# Patient Record
Sex: Male | Born: 1990 | Race: Black or African American | Hispanic: No | Marital: Single | Smoking: Never smoker
Health system: Southern US, Community
[De-identification: ages and names within clinical notes are randomized; demographics above are authoritative.]

## PROBLEM LIST (undated history)

## (undated) DIAGNOSIS — L709 Acne, unspecified: Secondary | ICD-10-CM

## (undated) DIAGNOSIS — R9431 Abnormal electrocardiogram [ECG] [EKG]: Secondary | ICD-10-CM

## (undated) HISTORY — DX: Abnormal electrocardiogram (ECG) (EKG): R94.31

## (undated) HISTORY — PX: HAND SURGERY: SHX662

---
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
## 2010-06-08 ENCOUNTER — Emergency Department (HOSPITAL_COMMUNITY)
Admission: EM | Admit: 2010-06-08 | Discharge: 2010-06-08 | Disposition: A | Discharge status: Home / Self Care | Payer: Self-pay | Attending: Emergency Medicine | Admitting: Emergency Medicine

## 2010-06-08 ENCOUNTER — Emergency Department (HOSPITAL_COMMUNITY): Payer: Self-pay

## 2010-06-08 DIAGNOSIS — X500XXA Overexertion from strenuous movement or load, initial encounter: Secondary | ICD-10-CM | POA: Insufficient documentation

## 2010-06-08 DIAGNOSIS — IMO0002 Reserved for concepts with insufficient information to code with codable children: Secondary | ICD-10-CM | POA: Insufficient documentation

## 2010-06-08 DIAGNOSIS — Y9367 Activity, basketball: Secondary | ICD-10-CM | POA: Insufficient documentation

## 2010-06-08 DIAGNOSIS — M25569 Pain in unspecified knee: Secondary | ICD-10-CM | POA: Insufficient documentation

## 2010-08-26 NOTE — Op Note (Signed)
NAMEDAMIEL, BARTHOLD NO.:  [id number]   MEDICAL RECORD NO.:  [id number]          PATIENT TYPE:  EMS   LOCATION:  ED                           FACILITY:  [facility]   PHYSICIAN:  Dionne Ano. Gramig III, M.D.DATE OF BIRTH:  1991-02-19   DATE OF PROCEDURE:  09/26/2006  DATE OF DISCHARGE:  09/26/2006                               OPERATIVE REPORT   PREOPERATIVE DIAGNOSIS:  Laceration to the right hand and wrist in two  different levels with retained large foreign body and dysesthesias in  the common digital nerve to the second web space and radial digital  nerve to the index finger region.   POSTOPERATIVE DIAGNOSIS:  Laceration to the right hand and wrist in two  different levels with retained large foreign body and dysesthesias in  the common digital nerve to the second web space and radial digital  nerve to the index finger region.   PROCEDURE:  1. Incision and drainage skin, subcutaneous tissue and deep fascial as      well as tendon tissue right palm.  2. Large foreign body removal, right palm.  3. Incision and drainage right wrist laceration 2 cm in length skin,      subcutaneous tissue.  This was excisional debridement.  4. Neurolysis of the common digital nerve to the second web space and      a separate neurolysis to the radial digital nerve to the index      finger under loupe magnification.   SURGEON:  Dionne Ano. Amanda Pea, M.D.   ASSISTANT:  None.   COMPLICATIONS:  None.   ANESTHESIA:  General.   TOURNIQUET TIME:  Less than hour.   INDICATIONS FOR PROCEDURE:  This patient presents with a very large  foreign body, dysesthesias and inability to move the hand.  I have  discussed with him the risks and benefits of surgery including risk of  infection, bleeding anesthesia, damage normal structures and failure of  the surgery to accomplish its intended goals of relieving symptoms and  restoring function.  The object in question is a very large piece of  glass.   The patient's mother was consented, the patient underwent a full  discussion preoperatively.  Arm was marked.  Consent was signed.  He was  taken to the operative suite.   Under general anesthesia, the patient underwent thorough prep and drape  with Betadine scrub paint.  Following this I&D skin, subcutaneous  tissue, tendon and muscle tissue was accomplished as was an excisional  debridement in the palm.  Following this, I then removed a very large  piece of glass greater than 1 to 1-1/2 inch.  This was removed and set  aside and later given to the patient.  Following removal of the very  large piece of glass I then performed a neurolysis of the second web  common digital nerve.  This was meticulously and carefully neurolysed.  It was bruised but was intact.   Following this, I then performed a neurolysis of the radial digital  nerve to the index finger.  This was contused but noted to  be intact,  fortunately.  There was no fascicular disruption.   Following this, I irrigated copiously and very loosely closed the wound.   Following this the patient underwent I&D of the wrist laceration.  The  wrist underwent I&D of skin, subcutaneous tissue.  A 2 cm laceration was  very carefully probed and cleaned.  Following this, direct closure was  accomplished.   The patient tolerated this well.  The flexor tendons were checked in the  palm region and were intact.  There was no complicating features.  Thorough exploration and I&D was performed and there were no  complicating features.   The patient's family was counseled in regard to the upper extremity  predicament.  He was dressed sterilely and will be discharged home on  appropriate pain medicine as well as postoperative measures including  elevation, return to the office in 3-5 days and he will be discharged  also on Keflex 500 mg one p.o. q.i.d. times 10 days, Vicodin 1-2 q.4-6h.  p.r.n. pain p.o.  All questions have been  encouraged and answered.  It  was a pleasure to see him and participate in his care.           ______________________________  Dionne Ano. Everlene Other, M.D.     Nash Mantis  D:  10/26/2006  T:  10/27/2006  Job:  [id number]

## 2010-08-29 NOTE — Op Note (Signed)
NAMEIVAN, LACHER NO.:  [id number]   MEDICAL RECORD NO.:  [id number]          PATIENT TYPE:  EMS   LOCATION:  ED                           FACILITY:  [facility]   PHYSICIAN:  Dionne Ano. Gramig III, M.D.DATE OF BIRTH:  29-Nov-1990   DATE OF PROCEDURE:  09/27/2006  DATE OF DISCHARGE:                               OPERATIVE REPORT   PREOPERATIVE DIAGNOSIS:  Status post large glass laceration to the right  hand, sustaining lacerations to the wrist crease and palm with  significant embedded foreign glass x2 large pieces, rule out  neurovascular injury.   POSTOPERATIVE DIAGNOSIS:  Status post large glass laceration to the  right hand, sustaining lacerations to the wrist crease and palm with  significant embedded foreign glass x2 large pieces, rule out  neurovascular injury.   OPERATION PERFORMED:  1. Incision and drainage of subcutaneous tissue as well as muscle and      tendon tissue, right palm.  This was excisional debridement.  2. Removal of two foreign bodies in the form of large glass pieces,      one measuring greater than 1.5 inches, the second measuring 1 cm.  3. Common digital nerve to the second web space neurolysis.  4. Radial digital nerve neurolysis, right index finger.  5. Incision and drainage, right wrist skin and subcutaneous tissue      with direct closure.   SURGEON:  Dionne Ano. Amanda Pea, M.D.   ASSISTANT:  None.   COMPLICATIONS:  None.   ANESTHESIA:  General.   TOURNIQUET TIME:  Less than an hour.   INDICATIONS FOR PROCEDURE:  This patient is a 20 year old male presents  to the emergency room for evaluation and treatment.  He has two large  pieces of glass in his palm and difficulty flexing and extending the  digits.  The patient and I discussed his upper extremity predicament and  I have extensively outlined my recommendations to his mother and father.  They desire to proceed with the above mentioned operative intervention  given the  large embedded material, dysfunction of his hand, etc.  With  this in mind, we will proceed accordingly.   DESCRIPTION OF PROCEDURE:  The patient was taken to the procedural  suite.  Palm  was marked, consent signed and full discussion with the  parents was complete.  He was taken to the operative suite, underwent a  general anesthetic.  Once this was done he was laid supine,  appropriately padded, prepped and draped in the usual sterile fashion  with Betadine scrub and paint about the left upper extremity.  Following  this, the patient then underwent sterile field application and his  operation commenced with insufflation of the tourniquet to 250 mmHg.  Incision and drainage of skin and subcutaneous tissue at the wrist  crease was accomplished. There was no deep penetration. This was an  excisional debridement.  Copiously irrigated and then closed with  Prolene.   Following this, an incision 1.5 cm proximal and distal to the entrance  site of the puncture wound in the midpalm was enlarged.  Dissection was  carried down, palmar fascia incised and retractor was placed.  Once  retractor was placed, I then very carefully removed a 1 cm piece of  glass followed by removal of a second 1.5 inch piece of glass.  Following this, I then performed a neurolysis of the common digital  nerve to the second web space and the radial digital nerve to the index  finger.  Both nerves were intact, somewhat contused and underwent a  neurolysis with evacuation of hematoma.  I then irrigated copiously with  tourniquet deflated and ample amounts of saline placed in the wound for  lavage purposes.  I found no signs of infection, no evidence of an  obvious flexor tendon disrupture.   The patient tolerated the procedure well. Hemostasis was adequate,  compartments were soft and there were no complicating features.  The  wound was loosely closed with Prolene and sterile dressing was applied.  The two glass  fragments were given to the patient's family at their  request and positively identified at time of surgery.  The patient had  no complications from the surgery.  He was dressed sterilely, taken to  recovery room, extubated and was noted to be in stable condition.  He  will be discharged home on Keflex 500 mg 1 by mouth four times daily x  10 days and in addition to this will be placed on Vicodin 5 mg 2 every  four to six hours as needed for pain by mouth.  Ice, elevation, finger  range of motion, edema control and other measures will be adhered to.  If he has any problems they will notify me.  Otherwise I look forward to  seeing him in five to seven days.  All questions have been encouraged  and answered.           ______________________________  Dionne Ano. Everlene Other, M.D.     Nash Mantis  D:  09/27/2006  T:  09/27/2006  Job:  [id number]

## 2011-06-15 ENCOUNTER — Emergency Department (HOSPITAL_COMMUNITY): Payer: 59

## 2011-06-15 ENCOUNTER — Emergency Department (HOSPITAL_COMMUNITY)
Admission: EM | Admit: 2011-06-15 | Discharge: 2011-06-15 | Disposition: A | Discharge status: Home / Self Care | Payer: 59 | Attending: Emergency Medicine | Admitting: Emergency Medicine

## 2011-06-15 ENCOUNTER — Encounter (HOSPITAL_COMMUNITY): Payer: Self-pay | Admitting: Emergency Medicine

## 2011-06-15 DIAGNOSIS — IMO0002 Reserved for concepts with insufficient information to code with codable children: Secondary | ICD-10-CM | POA: Insufficient documentation

## 2011-06-15 DIAGNOSIS — H5710 Ocular pain, unspecified eye: Secondary | ICD-10-CM | POA: Insufficient documentation

## 2011-06-15 DIAGNOSIS — S0181XA Laceration without foreign body of other part of head, initial encounter: Secondary | ICD-10-CM

## 2011-06-15 DIAGNOSIS — S0180XA Unspecified open wound of other part of head, initial encounter: Secondary | ICD-10-CM | POA: Insufficient documentation

## 2011-06-15 DIAGNOSIS — Y9367 Activity, basketball: Secondary | ICD-10-CM | POA: Insufficient documentation

## 2011-06-15 NOTE — ED Notes (Signed)
Pt ambulatory to tx rm. Pt collided with another player during basketball. Pt alert, oriented and in no acute distress. Awaiting md eval.

## 2011-06-15 NOTE — ED Notes (Signed)
MD at bedside. 

## 2011-06-15 NOTE — ED Provider Notes (Signed)
History     CSN: [id number]  Arrival date & time 06/15/11  1736   First MD Initiated Contact with Patient 06/15/11 1829      Chief Complaint  Patient presents with  . Eye Injury    (Consider location/radiation/quality/duration/timing/severity/associated sxs/prior treatment) HPI History provided by patient.  21 year old male presented with complaint of left face injury. Patient reports that around 4 PM today (about 2 hours to prior emergency department arrival) patient was playing basketball and was struck in his left cheek by another player. Patient thinks it was an elbow, although is not certain. Patient experienced immediate severe pain without LOC, blurry vision, or neck pain.  No pain with EOM movement. Patient had local pain and headache which has since resolved. Patient had a Steri-Strip placed PTA.  Tetanus is up to date. Patient has not been seen by a physician for this current complaint.   History reviewed. No pertinent past medical history.  History reviewed. No pertinent past surgical history.  No family history on file.  History  Substance Use Topics  . Smoking status: Never Smoker   . Smokeless tobacco: Not on file  . Alcohol Use: No      Review of Systems  Constitutional: Negative for fever and chills.  HENT: Negative for congestion, sore throat and rhinorrhea.   Eyes: Negative for pain and visual disturbance.  Respiratory: Negative for cough and shortness of breath.   Cardiovascular: Negative for chest pain and palpitations.  Gastrointestinal: Negative for nausea, vomiting, abdominal pain and diarrhea.  Genitourinary: Negative for dysuria and hematuria.  Musculoskeletal: Negative for back pain and gait problem.  Skin: Positive for wound. Negative for rash.  Neurological: Negative for dizziness and headaches.  Psychiatric/Behavioral: Negative for confusion and agitation.  All other systems reviewed and are negative.    Allergies  Review of patient's  allergies indicates no known allergies.  Home Medications   Current Outpatient Rx  Name Route Sig Dispense Refill  . DOXYCYCLINE HYCLATE 100 MG PO CAPS Oral Take 100 mg by mouth daily.      BP 143/75  Pulse 60  Temp(Src) 98.4 F (36.9 C) (Oral)  Resp 12  SpO2 99%  Physical Exam  Nursing note and vitals reviewed. Constitutional: He is oriented to person, place, and time. He appears well-developed and well-nourished. No distress.  HENT:  Head: Normocephalic and atraumatic.    Right Ear: External ear normal.  Left Ear: External ear normal.  Nose: Nose normal.  Mouth/Throat: Oropharynx is clear and moist.  Eyes: Conjunctivae and EOM are normal. Pupils are equal, round, and reactive to light.  Neck: Normal range of motion. Neck supple.  Cardiovascular: Normal rate, regular rhythm and intact distal pulses.   No murmur heard. Pulmonary/Chest: Effort normal and breath sounds normal. No respiratory distress.  Abdominal: Soft. Bowel sounds are normal. He exhibits no distension. There is no tenderness.  Musculoskeletal: Normal range of motion. He exhibits no edema.  Neurological: He is alert and oriented to person, place, and time. No cranial nerve deficit. He exhibits normal muscle tone. Coordination normal.  Skin: Skin is dry. No rash noted. He is not diaphoretic.  Psychiatric: He has a normal mood and affect. Judgment normal.    ED Course  LACERATION REPAIR Performed by: Particia Lather Authorized by: Hurman Horn Consent: Verbal consent obtained. Body area: head/neck Location details: left cheek Irrigation solution: saline Skin closure: glue Patient tolerance: Patient tolerated the procedure well with no immediate complications.   (including critical care  time)  Labs Reviewed - No data to display Ct Orbitss W/o Cm  06/15/2011  *RADIOLOGY REPORT*  Clinical Data: Basketball injury, with laceration under the left eye.  Periorbital swelling.  CT ORBITS WITHOUT CONTRAST   Technique:  Multidetector CT imaging of the orbits was performed following the standard protocol without intravenous contrast.  Comparison: 09/26/2006  Findings: No orbital fracture is identified.  There is mild chronic left maxillary sinusitis.  The zygomatic arches appear intact and symmetric.  No fracture of the visualized nasal bones noted.  Left infraorbital soft tissue swelling noted.  The globes appear symmetric.  No postseptal/intraorbital abnormality is observed.  IMPRESSION:  1.  Left periorbital soft tissue swelling, especially inferiorly. No intraorbital abnormality or orbital fracture is observed. 2.  Mild chronic left maxillary sinusitis.  Original Report Authenticated By: Dellia Cloud, M.D.     1. Laceration of face       MDM  2-year-old male presenting status post left cheek injury from suspected elbow while playing basketball.  No LOC, vision change, or other neuro Sx.    Exam as above, laceration below Lt eye with TTP.  CT orbits ordered for r/o orbital/maxillary fx and neg.  Lac repaired as noted above with skin adhesive.  Wound care instructions reviewed; PCP f/u PRN.       Particia Lather, MD 06/16/11 [phone]

## 2011-06-15 NOTE — ED Provider Notes (Signed)
This 21 year old male was accidentally elbowed in the left infraorbital area with no change in vision no severe headache no loss of consciousness no amnesia but they left upper cheek horizontal laceration with left infraorbital rim bony pain and tenderness with pupils reactive, extraocular movements intact, and no diplopia, TMJs NT with normal jaw open and occlusion, nose NT.  I saw and evaluated the patient, reviewed the resident's note and I agree with the findings and plan and available during procedure.  Hurman Horn, MD 06/16/11 2251

## 2011-06-15 NOTE — Discharge Instructions (Signed)
Facial Laceration A facial laceration is a cut on the face. Lacerations usually heal quickly, but they need special care to reduce scarring. It will take 1 to 2 years for the scar to lose its redness and to heal completely. TREATMENT  Some facial lacerations may not require closure. Some lacerations may not be able to be closed due to an increased risk of infection. It is important to see your caregiver as soon as possible after an injury to minimize the risk of infection and to maximize the opportunity for successful closure. If closure is appropriate, pain medicines may be given, if needed. The wound will be cleaned to help prevent infection. Your caregiver will use stitches (sutures), staples, wound glue (adhesive), or skin adhesive strips to repair the laceration. These tools bring the skin edges together to allow for faster healing and a better cosmetic outcome. However, all wounds will heal with a scar.  Once the wound has healed, scarring can be minimized by covering the wound with sunscreen during the day for 1 full year. Use a sunscreen with an SPF of at least 30. Sunscreen helps to reduce the pigment that will form in the scar. When applying sunscreen to a completely healed wound, massage the scar for a few minutes to help reduce the appearance of the scar. Use circular motions with your fingertips, on and around the scar. Do not massage a healing wound. HOME CARE INSTRUCTIONS For wound adhesive:  You may briefly wet your wound in the shower or bath. Do not soak or scrub the wound. Do not swim. Avoid periods of heavy perspiration until the skin adhesive has fallen off on its own. After showering or bathing, gently pat the wound dry with a clean towel.   Do not apply liquid medicine, cream medicine, ointment medicine, or makeup to your wound while the skin adhesive is in place. This may loosen the film before your wound is healed.   If a dressing is placed over the wound, be careful not to  apply tape directly over the skin adhesive. This may cause the adhesive to be pulled off before the wound is healed.   Avoid prolonged exposure to sunlight or tanning lamps while the skin adhesive is in place. Exposure to ultraviolet light in the first year will darken the scar.   The skin adhesive will usually remain in place for 5 to 10 days, then naturally fall off the skin. Do not pick at the adhesive film.  You may need a tetanus shot if:  You cannot remember when you had your last tetanus shot.   You have never had a tetanus shot.  If you get a tetanus shot, your arm may swell, get red, and feel warm to the touch. This is common and not a problem. If you need a tetanus shot and you choose not to have one, there is a rare chance of getting tetanus. Sickness from tetanus can be serious. SEEK IMMEDIATE MEDICAL CARE IF:  You develop redness, pain, or swelling around the wound.   There is yellowish-white fluid (pus) coming from the wound.   You develop chills or a fever.  MAKE SURE YOU:  Understand these instructions.   Will watch your condition.   Will get help right away if you are not doing well or get worse.  Document Released: 05/07/2004 Document Revised: 03/19/2011 Document Reviewed: 09/22/2010 [facility] Patient Information 2012 [place], [place]

## 2011-06-15 NOTE — ED Notes (Signed)
PT. COLLIDED WITH ANOTHER PLAYER WHILE PLAYING BASKETBALL THIS AFTERNOON , PRESENTS WITH LACERATION AT LEFT LOWER EYE WITH MODERATE BLEEDING , NO LOC.

## 2011-06-15 NOTE — ED Notes (Signed)
Pt to CT

## 2011-12-08 ENCOUNTER — Ambulatory Visit (INDEPENDENT_AMBULATORY_CARE_PROVIDER_SITE_OTHER): Payer: 59 | Admitting: Family Medicine

## 2011-12-08 VITALS — BP 130/78 | HR 51 | Temp 97.5°F | Resp 16 | Ht 73.5 in | Wt 169.0 lb

## 2011-12-08 DIAGNOSIS — Z0000 Encounter for general adult medical examination without abnormal findings: Secondary | ICD-10-CM

## 2011-12-08 NOTE — Progress Notes (Signed)
Adrian Murray is a 21 y.o. male who presents to [facility] today for college sports physical. Patient is a Consulting civil engineer at [employer].  He is going to try out for the basketball team.  Feels well no current issues.  No chest pain, syncope, SOB with athletic exertion.     PMH: Reviewed significant for acne treated with doxycycline Past surgical: History of right ankle sprain, right foot fracture, right knee meniscus injury.   History  Substance Use Topics  . Smoking status: Never Smoker   . Smokeless tobacco: Not on file  . Alcohol Use: No   ROS as above  Medications reviewed. Current Outpatient Prescriptions  Medication Sig Dispense Refill  . doxycycline (VIBRAMYCIN) 100 MG capsule Take 100 mg by mouth daily.        Exam:  BP 130/78  Pulse 51  Temp 97.5 F (36.4 C) (Oral)  Resp 16  Ht 6' 1.5" (1.867 m)  Wt 169 lb (76.658 kg)  BMI 21.99 kg/m2  SpO2 100% Gen: Well NAD HEENT: EOMI,  MMM Lungs: CTABL Nl WOB Heart: RRR no MRG Exts: Non edematous BL  LE, warm and well perfused.  MSK: Normal range of motion and stability strength in all major joints  No results found for this or any previous visit (from the past 72 hour(s)).  Assessment and Plan: 21 y.o. male with complete sports physical.  Doing well. No activity limiting issues uncovered today.  Plan for sickle cell screen.  Followup as needed. Cleared to participate.

## 2011-12-15 ENCOUNTER — Telehealth: Payer: Self-pay

## 2011-12-15 NOTE — Telephone Encounter (Signed)
Patient request that we fax sickle cell test results to his job at [phone]. Does not need to be attn to anyone.

## 2011-12-15 NOTE — Telephone Encounter (Signed)
Sickle cell results faxed to [phone] via [employer].

## 2012-12-25 ENCOUNTER — Emergency Department (INDEPENDENT_AMBULATORY_CARE_PROVIDER_SITE_OTHER)
Admission: EM | Admit: 2012-12-25 | Discharge: 2012-12-25 | Disposition: A | Discharge status: Home / Self Care | Payer: 59 | Source: Home / Self Care | Attending: Family Medicine | Admitting: Family Medicine

## 2012-12-25 ENCOUNTER — Encounter (HOSPITAL_COMMUNITY): Payer: Self-pay | Admitting: *Deleted

## 2012-12-25 DIAGNOSIS — B86 Scabies: Secondary | ICD-10-CM

## 2012-12-25 HISTORY — DX: Acne, unspecified: L70.9

## 2012-12-25 MED ORDER — TRIAMCINOLONE ACETONIDE 0.1 % EX CREA: TOPICAL_CREAM | Freq: Two times a day (BID) | CUTANEOUS | Status: DC

## 2012-12-25 MED ORDER — PERMETHRIN 5 % EX CREA: TOPICAL_CREAM | CUTANEOUS | Status: DC

## 2012-12-25 NOTE — ED Provider Notes (Signed)
Adrian Murray is a 22 y.o. male who presents to Urgent Care today for pruritic papules. Patient has several pruritic papules in his pubic hair as well as his beard. These are present for several weeks. He has not tried any medications yet. They are not painful. He does not have any fevers or chills. Feels well otherwise.    No past medical history on file. History  Substance Use Topics  . Smoking status: Never Smoker   . Smokeless tobacco: Not on file  . Alcohol Use: No   ROS as above Medications reviewed. No current facility-administered medications for this encounter.   Current Outpatient Prescriptions  Medication Sig Dispense Refill  . permethrin (ELIMITE) 5 % cream Apply to affected area once  60 g  0  . triamcinolone cream (KENALOG) 0.1 % Apply topically 2 (two) times daily.  30 g  0    Exam:  BP 136/73  Pulse 60  Temp(Src) 97.3 F (36.3 C) (Oral)  Resp 17  SpO2 100% Gen: Well NAD SKIN: Small pruritic papules and pubic hair and on the base of the shaft of the penis. No lesions seen in the beard.  Was normal.   No results found for this or any previous visit (from the past 24 hour(s)). No results found.  Assessment and Plan: 22 y.o. male with scabies or pubic lice possibly.  Plan to treat with permethrin cream and triamcinolone cream.  If not improving followup for referral to dermatology.  Discussed warning signs or symptoms. Please see discharge instructions. Patient expresses understanding.      Rodolph Bong, MD 12/25/12 [phone]

## 2012-12-25 NOTE — ED Notes (Signed)
Pt was discharged at 0950, but unable to discharge out of system unless pain assessment documented now.  Pain assessment documented in error.

## 2012-12-25 NOTE — ED Notes (Signed)
Triage assessment performed per Dr. Denyse Amass.

## 2012-12-25 NOTE — Discharge Instructions (Signed)
Thank you for coming in today. Use the permethrin cream.  Apply on the body and leave on overnight. Wash off in the morning.  Use triamcinolone cream as needed for itching twice daily until the skin starts looking normal.  Come back if not getting better.  Scabies Scabies are small bugs (mites) that burrow under the skin and cause red bumps and severe itching. These bugs can only be seen with a microscope. Scabies are highly contagious. They can spread easily from person to person by direct contact. They are also spread through sharing clothing or linens that have the scabies mites living in them. It is not unusual for an entire family to become infected through shared towels, clothing, or bedding.  HOME CARE INSTRUCTIONS   Your caregiver may prescribe a cream or lotion to kill the mites. If cream is prescribed, massage the cream into the entire body from the neck to the bottom of both feet. Also massage the cream into the scalp and face if your child is less than 75 year old. Avoid the eyes and mouth. Do not wash your hands after application.  Leave the cream on for 8 to 12 hours. Your child should bathe or shower after the 8 to 12 hour application period. Sometimes it is helpful to apply the cream to your child right before bedtime.  One treatment is usually effective and will eliminate approximately 95% of infestations. For severe cases, your caregiver may decide to repeat the treatment in 1 week. Everyone in your household should be treated with one application of the cream.  New rashes or burrows should not appear within 24 to 48 hours after successful treatment. However, the itching and rash may last for 2 to 4 weeks after successful treatment. Your caregiver may prescribe a medicine to help with the itching or to help the rash go away more quickly.  Scabies can live on clothing or linens for up to 3 days. All of your child's recently used clothing, towels, stuffed toys, and bed linens should  be washed in hot water and then dried in a dryer for at least 20 minutes on high heat. Items that cannot be washed should be enclosed in a plastic bag for at least 3 days.  To help relieve itching, bathe your child in a cool bath or apply cool washcloths to the affected areas.  Your child may return to school after treatment with the prescribed cream. SEEK MEDICAL CARE IF:   The itching persists longer than 4 weeks after treatment.  The rash spreads or becomes infected. Signs of infection include red blisters or yellow-tan crust. Document Released: 03/30/2005 Document Revised: 06/22/2011 Document Reviewed: 08/08/2008 [facility] Patient Information 2014 [place], [place].

## 2013-02-13 ENCOUNTER — Emergency Department (HOSPITAL_COMMUNITY)
Admission: EM | Admit: 2013-02-13 | Discharge: 2013-02-14 | Disposition: A | Discharge status: Home / Self Care | Payer: 59 | Attending: Emergency Medicine | Admitting: Emergency Medicine

## 2013-02-13 ENCOUNTER — Emergency Department (HOSPITAL_COMMUNITY): Payer: 59

## 2013-02-13 ENCOUNTER — Encounter (HOSPITAL_COMMUNITY): Payer: Self-pay | Admitting: Emergency Medicine

## 2013-02-13 DIAGNOSIS — Z872 Personal history of diseases of the skin and subcutaneous tissue: Secondary | ICD-10-CM | POA: Insufficient documentation

## 2013-02-13 DIAGNOSIS — K859 Acute pancreatitis without necrosis or infection, unspecified: Secondary | ICD-10-CM | POA: Insufficient documentation

## 2013-02-13 LAB — COMPREHENSIVE METABOLIC PANEL
Albumin: 4.4 g/dL (ref 3.5–5.2)
Alkaline Phosphatase: 71 U/L (ref 39–117)
BUN: 18 mg/dL (ref 6–23)
Calcium: 10.5 mg/dL (ref 8.4–10.5)
GFR calc Af Amer: >90 mL/min (ref >90)
Glucose, Bld: 85 mg/dL (ref 70–99)
Potassium: 3.4 mEq/L — ABNORMAL LOW (ref 3.5–5.1)
Sodium: 139 mEq/L (ref 135–145)
Total Protein: 7.9 g/dL (ref 6.0–8.3)

## 2013-02-13 LAB — URINALYSIS, ROUTINE W REFLEX MICROSCOPIC
Glucose, UA: NEGATIVE mg/dL
Ketones, ur: NEGATIVE mg/dL
Leukocytes, UA: NEGATIVE
Nitrite: NEGATIVE
Specific Gravity, Urine: 1.029 (ref 1.005–1.030)
pH: 6 (ref 5.0–8.0)

## 2013-02-13 LAB — CBC WITH DIFFERENTIAL/PLATELET
Basophils Relative: 0 % (ref 0–1)
Eosinophils Absolute: 0 10*3/uL (ref 0.0–0.7)
Eosinophils Relative: 0 % (ref 0–5)
Lymphs Abs: 1.8 10*3/uL (ref 0.7–4.0)
MCH: 29.9 pg (ref 26.0–34.0)
MCHC: 34.7 g/dL (ref 30.0–36.0)
MCV: 86.3 fL (ref 78.0–100.0)
Monocytes Relative: 7 % (ref 3–12)
Neutrophils Relative %: 74 % (ref 43–77)
Platelets: 206 10*3/uL (ref 150–400)

## 2013-02-13 LAB — LIPASE, BLOOD: Lipase: 208 U/L — ABNORMAL HIGH (ref 11–59)

## 2013-02-13 MED ORDER — SODIUM CHLORIDE 0.9 % IV BOLUS (SEPSIS)
1000.0000 mL | Freq: Once | INTRAVENOUS | Status: AC
  Administered 2013-02-13: 1000 mL via INTRAVENOUS

## 2013-02-13 MED ORDER — ONDANSETRON HCL 4 MG/2ML IJ SOLN
4.0000 mg | Freq: Once | INTRAMUSCULAR | Status: AC
  Administered 2013-02-13: 4 mg via INTRAVENOUS
  Filled 2013-02-13: qty 2

## 2013-02-13 MED ORDER — IOHEXOL 300 MG/ML  SOLN
100.0000 mL | Freq: Once | INTRAMUSCULAR | Status: AC | PRN
  Administered 2013-02-13: 100 mL via INTRAVENOUS

## 2013-02-13 MED ORDER — MORPHINE SULFATE 4 MG/ML IJ SOLN
4.0000 mg | Freq: Once | INTRAMUSCULAR | Status: AC
  Administered 2013-02-13: 4 mg via INTRAVENOUS
  Filled 2013-02-13: qty 1

## 2013-02-13 MED ORDER — IOHEXOL 300 MG/ML  SOLN
50.0000 mL | Freq: Once | INTRAMUSCULAR | Status: AC | PRN
  Administered 2013-02-13: 50 mL via ORAL

## 2013-02-13 NOTE — ED Provider Notes (Signed)
CSN: [id number]     Arrival date & time 02/13/13  2047 History   First MD Initiated Contact with Patient 02/13/13 2134     Chief Complaint  Patient presents with  . Abdominal Pain   (Consider location/radiation/quality/duration/timing/severity/associated sxs/prior Treatment) HPI Adrian Murray is a 22 y.o. male who presents to emergency department with complaint of abdominal pain. Patient states the pain has been there for 2 days, comes and goes, told the nurse at triage his pain was in the right lower quadrant however he tells me that his pain is in the left upper quadrant now. States it moves around the lower abdomen. He denies any nausea, vomiting, diarrhea or constipation. She denies taking any medications for this. He states he does not have pain when he is jumping and moving around, and does not have pain with palpation. He denies any prior similar symptoms in the past. He denies any prior abdominal surgeries. He does admit to alcohol drinking over the weekend, states drank heavily. He states he only drinks on the weekends the  Past Medical History  Diagnosis Date  . Acne    Past Surgical History  Procedure Laterality Date  . Hand surgery Right    History reviewed. No pertinent family history. History  Substance Use Topics  . Smoking status: Never Smoker   . Smokeless tobacco: Not on file  . Alcohol Use: Yes     Comment: occasional    Review of Systems  Constitutional: Negative for fever and chills.  Respiratory: Negative for cough, chest tightness and shortness of breath.   Cardiovascular: Negative for chest pain, palpitations and leg swelling.  Gastrointestinal: Positive for abdominal pain. Negative for nausea, vomiting, diarrhea and abdominal distention.  Genitourinary: Negative for dysuria, urgency, frequency and hematuria.  Musculoskeletal: Negative for arthralgias, myalgias, neck pain and neck stiffness.  Skin: Negative for rash.  Allergic/Immunologic: Negative for  immunocompromised state.  Neurological: Negative for dizziness, weakness, light-headedness, numbness and headaches.    Allergies  Review of patient's allergies indicates no known allergies.  Home Medications  No current outpatient prescriptions on file. BP 149/84  Pulse 65  Temp(Src) 97.6 F (36.4 C) (Oral)  SpO2 98% Physical Exam  Nursing note and vitals reviewed. Constitutional: He is oriented to person, place, and time. He appears well-developed and well-nourished. No distress.  HENT:  Head: Normocephalic and atraumatic.  Eyes: Conjunctivae are normal.  Neck: Neck supple.  Cardiovascular: Normal rate, regular rhythm and normal heart sounds.   Pulmonary/Chest: Effort normal. No respiratory distress. He has no wheezes. He has no rales.  Abdominal: Soft. Bowel sounds are normal. He exhibits no distension. There is no tenderness. There is no rebound.  No tenderness on exam, no CVA tenderness  Musculoskeletal: He exhibits no edema.  Neurological: He is alert and oriented to person, place, and time.  Skin: Skin is warm and dry.    ED Course  Procedures (including critical care time) Labs Review Labs Reviewed  CBC WITH DIFFERENTIAL - Abnormal; Notable for the following:    HCT 38.9 (*)    All other components within normal limits  COMPREHENSIVE METABOLIC PANEL - Abnormal; Notable for the following:    Potassium 3.4 (*)    All other components within normal limits  URINALYSIS, ROUTINE W REFLEX MICROSCOPIC - Abnormal; Notable for the following:    APPearance CLOUDY (*)    All other components within normal limits  LIPASE, BLOOD - Abnormal; Notable for the following:    Lipase 208 (*)  All other components within normal limits   Imaging Review Ct Abdomen Pelvis W Contrast  02/14/2013   CLINICAL DATA:  22 year old male with epigastric pain. Initial encounter.  EXAM: CT ABDOMEN AND PELVIS WITH CONTRAST  TECHNIQUE: Multidetector CT imaging of the abdomen and pelvis was  performed using the standard protocol following bolus administration of intravenous contrast.  CONTRAST:  50mL OMNIPAQUE IOHEXOL 300 MG/ML SOLN, OMNIPAQUE IOHEXOL 300 MG/ML SOLN  COMPARISON:  Abdomen radiographs from the same day.  FINDINGS: There is mild cardiomegaly. No pericardial or pleural effusion. Negative lung bases. No acute osseous abnormality identified.  No pelvic free fluid. Diminutive bladder appears normal. Negative distal colon. Negative left colon. Retained stool in the transverse colon which otherwise appears normal. Right colon is within normal limits. Normal appendix best seen on coronal image 32. No dilated small bowel. Oral contrast has not yet reached the distal small bowel.  Stomach is within normal limits. Duodenum within normal limits. Negative early vascular contrast phase appearance of the liver. Gallbladder, spleen, pancreas, adrenal glands, portal venous system, and major arterial structures in the abdomen and pelvis are within normal limits. No abdominal free fluid. Normal kidneys.  IMPRESSION: Normal appendix. Negative CT of the abdomen and pelvis.   Electronically Signed   By: Augusto Gamble M.D.   On: 02/14/2013 00:14   Dg Abd 2 Views  02/13/2013   CLINICAL DATA:  Left-sided abdominal pain.  EXAM: ABDOMEN - 2 VIEW  COMPARISON:  None.  FINDINGS: The bowel gas pattern is normal. No free air or free fluid. Tiny calcification in the left side of the pelvis. No osseous abnormality of significance.  IMPRESSION: Tiny calcification in the left side of the pelvis. This could represent a phlebolith or a distal left ureteral stone. Does the patient have hematuria or left flank pain?   Electronically Signed   By: Geanie Cooley M.D.   On: 02/13/2013 21:58    EKG Interpretation   None       MDM   1. Pancreatitis     Patient with diffuse abdominal pain for 2 days with no abdominal tenderness on exam. Blood work unremarkable other than lipase of 208. Patient does admit to heavy  drinking over the last couple days. He states he's not a drinker that and does not have an alcohol problem. He has no history of pancreatitis or any other abdominal problems. Discussed with Dr. Freida Busman who recommended CT scan. CT scan is back to normal. Results discussed with patient. Other than alcohol drinking, patient does not have any risk factors for pancreatitis. He does not take any medications. Does not take any over-the-counter supplements. He has no family history of pancreatitis. He has no right upper quadrant abdominal pain or any concern for gallbladder disease.  Patient has a followup appointment scheduled for 2 days from today, advised to have his lipase rechecked at that time. Plan to discharge home for oral fluids, Norco for pain, close followup. Advised to return if her symptoms are worsening.  Filed Vitals:   02/13/13 2051  BP: 149/84  Pulse: 65  Temp: 97.6 F (36.4 C)  TempSrc: Oral  SpO2: 98%      Algie Cales A Jamez Ambrocio, PA-C 02/14/13 0045

## 2013-02-13 NOTE — ED Notes (Signed)
Pt states for the last 2 days he has had sharp RLQ pain. Denies urinary sx,or NVD.

## 2013-02-14 MED ORDER — OXYCODONE-ACETAMINOPHEN 5-325 MG PO TABS
1.0000 | ORAL_TABLET | Freq: Once | ORAL | Status: AC
  Administered 2013-02-14: 1 via ORAL
  Filled 2013-02-14: qty 1

## 2013-02-14 MED ORDER — HYDROCODONE-ACETAMINOPHEN 5-325 MG PO TABS: 1.0000 | ORAL_TABLET | Freq: Four times a day (QID) | ORAL | Status: DC | PRN

## 2013-02-14 NOTE — ED Provider Notes (Signed)
Medical screening examination/treatment/procedure(s) were performed by non-physician practitioner and as supervising physician I was immediately available for consultation/collaboration.  Ieisha Gao T Evann Koelzer, MD 02/14/13 2321 

## 2013-02-15 ENCOUNTER — Encounter (HOSPITAL_COMMUNITY): Payer: Self-pay | Admitting: Emergency Medicine

## 2013-02-15 ENCOUNTER — Inpatient Hospital Stay (HOSPITAL_COMMUNITY)
Admission: EM | Admit: 2013-02-15 | Discharge: 2013-02-18 | DRG: 440 | Disposition: A | Discharge status: Home / Self Care | Payer: 59 | Attending: Internal Medicine | Admitting: Internal Medicine

## 2013-02-15 DIAGNOSIS — Z0000 Encounter for general adult medical examination without abnormal findings: Secondary | ICD-10-CM

## 2013-02-15 DIAGNOSIS — F1010 Alcohol abuse, uncomplicated: Secondary | ICD-10-CM | POA: Diagnosis present

## 2013-02-15 DIAGNOSIS — E876 Hypokalemia: Secondary | ICD-10-CM | POA: Diagnosis present

## 2013-02-15 DIAGNOSIS — Z23 Encounter for immunization: Secondary | ICD-10-CM

## 2013-02-15 DIAGNOSIS — K859 Acute pancreatitis without necrosis or infection, unspecified: Principal | ICD-10-CM | POA: Diagnosis present

## 2013-02-15 LAB — COMPREHENSIVE METABOLIC PANEL
ALT: 11 U/L (ref 0–53)
Alkaline Phosphatase: 67 U/L (ref 39–117)
BUN: 12 mg/dL (ref 6–23)
CO2: 27 mEq/L (ref 19–32)
Chloride: 98 mEq/L (ref 96–112)
GFR calc Af Amer: >90 mL/min (ref >90)
Glucose, Bld: 96 mg/dL (ref 70–99)
Potassium: 3.4 mEq/L — ABNORMAL LOW (ref 3.5–5.1)
Sodium: 136 mEq/L (ref 135–145)
Total Bilirubin: 1.1 mg/dL (ref 0.3–1.2)
Total Protein: 7.6 g/dL (ref 6.0–8.3)

## 2013-02-15 LAB — CBC WITH DIFFERENTIAL/PLATELET
Eosinophils Absolute: 0.1 10*3/uL (ref 0.0–0.7)
Hemoglobin: 13.1 g/dL (ref 13.0–17.0)
Lymphocytes Relative: 20 % (ref 12–46)
Lymphs Abs: 2 10*3/uL (ref 0.7–4.0)
Monocytes Relative: 7 % (ref 3–12)
Neutro Abs: 7.6 10*3/uL (ref 1.7–7.7)
Neutrophils Relative %: 73 % (ref 43–77)
Platelets: 192 10*3/uL (ref 150–400)
RBC: 4.3 MIL/uL (ref 4.22–5.81)
WBC: 10.4 10*3/uL (ref 4.0–10.5)

## 2013-02-15 MED ORDER — MORPHINE SULFATE 2 MG/ML IJ SOLN
1.0000 mg | INTRAMUSCULAR | Status: DC | PRN
  Administered 2013-02-15: 2 mg via INTRAVENOUS
  Filled 2013-02-15 (×3): qty 1

## 2013-02-15 MED ORDER — INFLUENZA VAC SPLIT QUAD 0.5 ML IM SUSP
0.5000 mL | INTRAMUSCULAR | Status: AC
  Administered 2013-02-16: 0.5 mL via INTRAMUSCULAR
  Filled 2013-02-15 (×2): qty 0.5

## 2013-02-15 MED ORDER — SODIUM CHLORIDE 0.9 % IV SOLN
INTRAVENOUS | Status: AC
  Administered 2013-02-15: 125 mL/h via INTRAVENOUS

## 2013-02-15 MED ORDER — HYDROMORPHONE HCL PF 1 MG/ML IJ SOLN
1.0000 mg | INTRAMUSCULAR | Status: AC
Start: 2013-02-15 — End: 2013-02-15
  Administered 2013-02-15: 1 mg via INTRAVENOUS
  Filled 2013-02-15: qty 1

## 2013-02-15 MED ORDER — HYDROMORPHONE HCL PF 1 MG/ML IJ SOLN
1.0000 mg | INTRAMUSCULAR | Status: AC | PRN
  Administered 2013-02-15: 1 mg via INTRAVENOUS
  Filled 2013-02-15: qty 1

## 2013-02-15 MED ORDER — HYDROMORPHONE HCL PF 1 MG/ML IJ SOLN
0.5000 mg | INTRAMUSCULAR | Status: DC | PRN
  Administered 2013-02-15 (×2): 0.5 mg via INTRAVENOUS
  Filled 2013-02-15 (×2): qty 1

## 2013-02-15 MED ORDER — ENOXAPARIN SODIUM 40 MG/0.4ML ~~LOC~~ SOLN
40.0000 mg | SUBCUTANEOUS | Status: DC
  Administered 2013-02-15 – 2013-02-17 (×3): 40 mg via SUBCUTANEOUS
  Filled 2013-02-15 (×4): qty 0.4

## 2013-02-15 MED ORDER — ZOLPIDEM TARTRATE 5 MG PO TABS: 5.0000 mg | ORAL_TABLET | Freq: Every evening | ORAL | Status: DC | PRN

## 2013-02-15 MED ORDER — INFLUENZA VAC SPLIT QUAD 0.5 ML IM SUSP
0.5000 mL | INTRAMUSCULAR | Status: DC
  Filled 2013-02-15: qty 0.5

## 2013-02-15 MED ORDER — ONDANSETRON HCL 4 MG PO TABS: 4.0000 mg | ORAL_TABLET | Freq: Four times a day (QID) | ORAL | Status: DC | PRN

## 2013-02-15 MED ORDER — SODIUM CHLORIDE 0.9 % IV SOLN
INTRAVENOUS | Status: DC
  Administered 2013-02-15: 125 mL via INTRAVENOUS
  Administered 2013-02-16: 125 mL/h via INTRAVENOUS
  Administered 2013-02-16 – 2013-02-18 (×5): via INTRAVENOUS

## 2013-02-15 MED ORDER — ONDANSETRON HCL 4 MG/2ML IJ SOLN: 4.0000 mg | Freq: Four times a day (QID) | INTRAMUSCULAR | Status: DC | PRN

## 2013-02-15 MED ORDER — SODIUM CHLORIDE 0.9 % IV BOLUS (SEPSIS)
1000.0000 mL | INTRAVENOUS | Status: AC
  Administered 2013-02-15: 1000 mL via INTRAVENOUS

## 2013-02-15 NOTE — H&P (Signed)
[facility] History and Physical  Adrian Murray:[id number] DOB: 1991-04-04 DOA: 02/15/2013  Referring physician: PCP: Provider Not In System  Specialists:   Chief Complaint: Abdominal Pain  HPI: Adrian Murray 22 yo BM PMHx Acute Pancreatitis diagnosed 02/13/2013 (lipase 208, now 918) secondary to alcohol abuse. States last use of alcohol 2 days ago. States is a binge drinker (only drinks on weekends usually 4-5 beers per week and); the patient was discharged home on by mouth pain medication and instructions to remain n.p.o. Presents with left upper quadrant pain for the last 4 days. The patient was seen here 2 days ago and diagnosed with pancreatitis. CT non-contrib, elev lipase in the 200's. He notes that 4 days ago he drank heavily and began having abdominal pain the following day. He states that his pain has persisted despite taking narcotic pain medicine. He is able to tolerate liquids at home but has not attempted solid food yet. He denies any vomiting, diarrhea, or fevers. He notes no worsening in his pain but continued pain. He is afebrile and vital signs are unremarkable here. Will get pain control, IV fluids, and repeat lab work. TODAY states was sent home from the ED 2 days ago with a new diagnosis of pancreatitis, and instructed to only drink clear liquids and use by mouth pain medication. States pain became stronger more had to return to the ED. Currently states pain is just mild but just recently was given IV pain medication   Review of Systems: The patient denies anorexia, fever, weight loss,, vision loss, decreased hearing, hoarseness, chest pain, syncope, dyspnea on exertion, peripheral edema, balance deficits, hemoptysis,  melena, hematochezia, hematuria, incontinence, genital sores, muscle weakness, suspicious skin lesions, transient blindness, difficulty walking, depression, unusual weight change, abnormal bleeding, enlarged lymph nodes, angioedema, and breast masses.     TRAVEL HISTORY: None   Past Medical History  Diagnosis Date  . Acne    Past Surgical History  Procedure Laterality Date  . Hand surgery Right    Social History:  reports that he has never smoked. He does not have any smokeless tobacco history on file. He reports that he drinks alcohol. He reports that he does not use illicit drugs.   No Known Allergies  History reviewed. No pertinent family history.   Prior to Admission medications   Medication Sig Start Date End Date Taking? Authorizing Provider  HYDROcodone-acetaminophen (NORCO) 5-325 MG per tablet Take 1 tablet by mouth every 6 (six) hours as needed. 02/14/13  Yes Tatyana A Kirichenko, PA-C   Physical Exam: Filed Vitals:   02/15/13 0651 02/15/13 0754  BP: 143/78 145/67  Pulse: 63 70  Temp: 97.4 F (36.3 C)   TempSrc: Oral   Resp: 20 20  SpO2: 99% 100%     General:  A./O. x4, NAD (just received pain medication)  Eyes: Pupils equal reactive to light and accommodation  Neck: Negative JVD  Cardiovascular: Regular rhythm and rate, negative murmurs rubs gallops, DP/PT pulse 2+ bilateral  Respiratory: Clear off the bilateral  Abdomen: Soft, mildly tender epigastric area, plus bowel sounds   Musculoskeletal: Negative pedal edema  Neurologic: A/O. x4, pupils equal react to light and accommodation, cranial nerves II through XII intact, all extremities strength 5/5, sensation intact    Labs on Admission:  Basic Metabolic Panel:  Recent Labs Lab 02/13/13 2209 02/15/13 0741  NA 139 136  K 3.4* 3.4*  CL 101 98  CO2 28 27  GLUCOSE 85 96  BUN 18  12  CREATININE 1.08 1.04  CALCIUM 10.5 10.3   Liver Function Tests:  Recent Labs Lab 02/13/13 2209 02/15/13 0741  AST 23 20  ALT 15 11  ALKPHOS 71 67  BILITOT 0.6 1.1  PROT 7.9 7.6  ALBUMIN 4.4 4.3    Recent Labs Lab 02/13/13 2209 02/15/13 0741  LIPASE 208* 918*   No results found for this basename: AMMONIA,  in the last 168 hours CBC:  Recent  Labs Lab 02/13/13 2209 02/15/13 0741  WBC 9.4 10.4  NEUTROABS 7.0 7.6  HGB 13.5 13.1  HCT 38.9* 37.0*  MCV 86.3 86.0  PLT 206 192   Cardiac Enzymes: No results found for this basename: CKTOTAL, CKMB, CKMBINDEX, TROPONINI,  in the last 168 hours  BNP (last 3 results) No results found for this basename: PROBNP,  in the last 8760 hours CBG: No results found for this basename: GLUCAP,  in the last 168 hours  Radiological Exams on Admission: Ct Abdomen Pelvis W Contrast  02/14/2013   CLINICAL DATA:  22 year old male with epigastric pain. Initial encounter.  EXAM: CT ABDOMEN AND PELVIS WITH CONTRAST  TECHNIQUE: Multidetector CT imaging of the abdomen and pelvis was performed using the standard protocol following bolus administration of intravenous contrast.  CONTRAST:  50mL OMNIPAQUE IOHEXOL 300 MG/ML SOLN, OMNIPAQUE IOHEXOL 300 MG/ML SOLN  COMPARISON:  Abdomen radiographs from the same day.  FINDINGS: There is mild cardiomegaly. No pericardial or pleural effusion. Negative lung bases. No acute osseous abnormality identified.  No pelvic free fluid. Diminutive bladder appears normal. Negative distal colon. Negative left colon. Retained stool in the transverse colon which otherwise appears normal. Right colon is within normal limits. Normal appendix best seen on coronal image 32. No dilated small bowel. Oral contrast has not yet reached the distal small bowel.  Stomach is within normal limits. Duodenum within normal limits. Negative early vascular contrast phase appearance of the liver. Gallbladder, spleen, pancreas, adrenal glands, portal venous system, and major arterial structures in the abdomen and pelvis are within normal limits. No abdominal free fluid. Normal kidneys.  IMPRESSION: Normal appendix. Negative CT of the abdomen and pelvis.   Electronically Signed   By: Augusto Gamble M.D.   On: 02/14/2013 00:14   Dg Abd 2 Views  02/13/2013   CLINICAL DATA:  Left-sided abdominal pain.  EXAM:  ABDOMEN - 2 VIEW  COMPARISON:  None.  FINDINGS: The bowel gas pattern is normal. No free air or free fluid. Tiny calcification in the left side of the pelvis. No osseous abnormality of significance.  IMPRESSION: Tiny calcification in the left side of the pelvis. This could represent a phlebolith or a distal left ureteral stone. Does the patient have hematuria or left flank pain?   Electronically Signed   By: Geanie Cooley M.D.   On: 02/13/2013 21:58    EKG:   Assessment/Plan Active Problems:   Pancreatitis   Pancreatitis; counseled patient will keep him n.p.o. for now, except for ice chips to suck on. -Ensure patient does not get dehydrated; normal saline at 121ml/hr -Pain control with IV morphine -Nausea control IV Zofran -Will hold patient n.p.o. until lipase normalizes and then advance diet   Hypokalemia; will monitor for now and correct if required  Code Status: Full  Family Communication: Discuss plan of care with patient and mother Disposition Plan:   Time spent:60 minutes    Kailan Laws, Roselind Messier [facility] Pager [phone]  If 7PM-7AM, please contact night-coverage [email or web address] Password [facility] 02/15/2013, 9:17  AM

## 2013-02-15 NOTE — ED Provider Notes (Signed)
CSN: [id number]     Arrival date & time 02/15/13  4098 History   First MD Initiated Contact with Patient 02/15/13 [phone]     Chief Complaint  Patient presents with  . Abdominal Pain   (Consider location/radiation/quality/duration/timing/severity/associated sxs/prior Treatment) Patient is a 22 y.o. male presenting with abdominal pain. The history is provided by the patient.  Abdominal Pain Pain location:  LUQ Pain quality: sharp   Pain radiates to:  Does not radiate Pain severity:  Moderate Onset quality:  Gradual Timing:  Constant Progression:  Unchanged Chronicity:  New Context: alcohol use   Relieved by:  Nothing Worsened by:  Nothing tried Ineffective treatments: norco. Associated symptoms: nausea   Associated symptoms: no chest pain, no cough, no diarrhea, no dysuria, no fever, no hematuria, no shortness of breath and no vomiting     Past Medical History  Diagnosis Date  . Acne    Past Surgical History  Procedure Laterality Date  . Hand surgery Right    History reviewed. No pertinent family history. History  Substance Use Topics  . Smoking status: Never Smoker   . Smokeless tobacco: Not on file  . Alcohol Use: Yes     Comment: occasional    Review of Systems  Constitutional: Negative for fever.  HENT: Negative for drooling and rhinorrhea.   Eyes: Negative for pain.  Respiratory: Negative for cough and shortness of breath.   Cardiovascular: Negative for chest pain and leg swelling.  Gastrointestinal: Positive for nausea and abdominal pain. Negative for vomiting and diarrhea.  Genitourinary: Negative for dysuria and hematuria.  Musculoskeletal: Negative for gait problem and neck pain.  Skin: Negative for color change.  Neurological: Negative for numbness and headaches.  Hematological: Negative for adenopathy.  Psychiatric/Behavioral: Negative for behavioral problems.  All other systems reviewed and are negative.    Allergies  Review of patient's allergies  indicates no known allergies.  Home Medications   Current Outpatient Rx  Name  Route  Sig  Dispense  Refill  . HYDROcodone-acetaminophen (NORCO) 5-325 MG per tablet   Oral   Take 1 tablet by mouth every 6 (six) hours as needed.   20 tablet   0    BP 143/78  Pulse 63  Temp(Src) 97.4 F (36.3 C) (Oral)  Resp 20  SpO2 99% Physical Exam  Nursing note and vitals reviewed. Constitutional: He is oriented to person, place, and time. He appears well-developed and well-nourished.  HENT:  Head: Normocephalic and atraumatic.  Right Ear: External ear normal.  Left Ear: External ear normal.  Nose: Nose normal.  Mouth/Throat: Oropharynx is clear and moist. No oropharyngeal exudate.  Eyes: Conjunctivae and EOM are normal. Pupils are equal, round, and reactive to light.  Neck: Normal range of motion. Neck supple.  Cardiovascular: Normal rate, regular rhythm, normal heart sounds and intact distal pulses.  Exam reveals no gallop and no friction rub.   No murmur heard. Pulmonary/Chest: Effort normal and breath sounds normal. No respiratory distress. He has no wheezes.  Abdominal: Soft. Bowel sounds are normal. He exhibits no distension. There is no tenderness. There is no rebound and no guarding.  No reproduction of pain w/ palpation of abdomen.   Musculoskeletal: Normal range of motion. He exhibits no edema and no tenderness.  Neurological: He is alert and oriented to person, place, and time.  Skin: Skin is warm and dry.  Psychiatric: He has a normal mood and affect. His behavior is normal.    ED Course  Procedures (including  critical care time) Labs Review Labs Reviewed  CBC WITH DIFFERENTIAL  COMPREHENSIVE METABOLIC PANEL  LIPASE, BLOOD  LACTATE DEHYDROGENASE   Imaging Review Ct Abdomen Pelvis W Contrast  02/14/2013   CLINICAL DATA:  22 year old male with epigastric pain. Initial encounter.  EXAM: CT ABDOMEN AND PELVIS WITH CONTRAST  TECHNIQUE: Multidetector CT imaging of the  abdomen and pelvis was performed using the standard protocol following bolus administration of intravenous contrast.  CONTRAST:  50mL OMNIPAQUE IOHEXOL 300 MG/ML SOLN, OMNIPAQUE IOHEXOL 300 MG/ML SOLN  COMPARISON:  Abdomen radiographs from the same day.  FINDINGS: There is mild cardiomegaly. No pericardial or pleural effusion. Negative lung bases. No acute osseous abnormality identified.  No pelvic free fluid. Diminutive bladder appears normal. Negative distal colon. Negative left colon. Retained stool in the transverse colon which otherwise appears normal. Right colon is within normal limits. Normal appendix best seen on coronal image 32. No dilated small bowel. Oral contrast has not yet reached the distal small bowel.  Stomach is within normal limits. Duodenum within normal limits. Negative early vascular contrast phase appearance of the liver. Gallbladder, spleen, pancreas, adrenal glands, portal venous system, and major arterial structures in the abdomen and pelvis are within normal limits. No abdominal free fluid. Normal kidneys.  IMPRESSION: Normal appendix. Negative CT of the abdomen and pelvis.   Electronically Signed   By: Augusto Gamble M.D.   On: 02/14/2013 00:14   Dg Abd 2 Views  02/13/2013   CLINICAL DATA:  Left-sided abdominal pain.  EXAM: ABDOMEN - 2 VIEW  COMPARISON:  None.  FINDINGS: The bowel gas pattern is normal. No free air or free fluid. Tiny calcification in the left side of the pelvis. No osseous abnormality of significance.  IMPRESSION: Tiny calcification in the left side of the pelvis. This could represent a phlebolith or a distal left ureteral stone. Does the patient have hematuria or left flank pain?   Electronically Signed   By: Geanie Cooley M.D.   On: 02/13/2013 21:58    EKG Interpretation   None       MDM   1. Pancreatitis   2. Hypokalemia    7:31 AM 22 y.o. male who presents with left upper quadrant pain for the last 4 days. The patient was seen here 2 days ago and  diagnosed with pancreatitis. CT non-contrib, elev lipase in the 200's.  He notes that 4 days ago he drank heavily and began having abdominal pain the following day. He states that his pain has persisted despite taking narcotic pain medicine. He is able to tolerate liquids at home but has not attempted solid food yet. He denies any vomiting, diarrhea, or fevers. He notes no worsening in his pain but continued pain. He is afebrile and vital signs are unremarkable here. Will get pain control, IV fluids, and repeat lab work.  Lipase continues to increase. Will admit for pain control.     Junius Argyle, MD 02/15/13 [phone]

## 2013-02-15 NOTE — ED Notes (Signed)
Pt states he was seen here yesterday for abd pain and was prescribed hydrocodone and went home  Pt states he took the pain medication around 5 and then took another around 1am  Pt states since then the pain has gotten worse and pt is c/o nausea

## 2013-02-16 ENCOUNTER — Other Ambulatory Visit: Payer: Self-pay

## 2013-02-16 LAB — COMPREHENSIVE METABOLIC PANEL
ALT: 10 U/L (ref 0–53)
AST: 17 U/L (ref 0–37)
Albumin: 4.1 g/dL (ref 3.5–5.2)
CO2: 25 mEq/L (ref 19–32)
Calcium: 10.3 mg/dL (ref 8.4–10.5)
Chloride: 98 mEq/L (ref 96–112)
Creatinine, Ser: 0.95 mg/dL (ref 0.50–1.35)
Potassium: 3.9 mEq/L (ref 3.5–5.1)
Sodium: 134 mEq/L — ABNORMAL LOW (ref 135–145)
Total Bilirubin: 1 mg/dL (ref 0.3–1.2)

## 2013-02-16 LAB — CBC WITH DIFFERENTIAL/PLATELET
Basophils Relative: 0 % (ref 0–1)
Eosinophils Relative: 0 % (ref 0–5)
HCT: 36.6 % — ABNORMAL LOW (ref 39.0–52.0)
Hemoglobin: 13.1 g/dL (ref 13.0–17.0)
Lymphocytes Relative: 19 % (ref 12–46)
Lymphs Abs: 1.5 10*3/uL (ref 0.7–4.0)
MCH: 30.5 pg (ref 26.0–34.0)
MCV: 85.3 fL (ref 78.0–100.0)
Monocytes Absolute: 0.4 10*3/uL (ref 0.1–1.0)
Neutro Abs: 6.2 10*3/uL (ref 1.7–7.7)
RBC: 4.29 MIL/uL (ref 4.22–5.81)

## 2013-02-16 MED ORDER — HYDROMORPHONE HCL PF 1 MG/ML IJ SOLN
1.0000 mg | INTRAMUSCULAR | Status: DC | PRN
  Administered 2013-02-16 – 2013-02-17 (×7): 1 mg via INTRAVENOUS
  Filled 2013-02-16 (×7): qty 1

## 2013-02-16 NOTE — Care Management Note (Signed)
    Page 1 of 1   02/16/2013     12:58:25 PM   CARE MANAGEMENT NOTE 02/16/2013  Patient:  Adrian Murray, Adrian Murray   Account Number:  [id number]  Date Initiated:  02/16/2013  Documentation initiated by:  Lorenda Ishihara  Subjective/Objective Assessment:   22 yo male admitted with acute pancreatitis, failed OP management. PTA lived at home.     Action/Plan:   Home when stable   Anticipated DC Date:  02/19/2013   Anticipated DC Plan:  HOME/SELF CARE      DC Planning Services  CM consult      Choice offered to / List presented to:             Status of service:  Completed, signed off Medicare Important Message given?   (If response is "NO", the following Medicare IM given date fields will be blank) Date Medicare IM given:   Date Additional Medicare IM given:    Discharge Disposition:  HOME/SELF CARE  Per UR Regulation:  Reviewed for med. necessity/level of care/duration of stay  If discussed at Long Length of Stay Meetings, dates discussed:    Comments:

## 2013-02-16 NOTE — Progress Notes (Signed)
[facility] PROGRESS NOTE  Adrian Murray WUJ:[id number] DOB: Dec 09, 1990 DOA: 02/15/2013 PCP: Provider Not In System  Assessment/Plan: Acute Pancreatitis -No further n/v. -Still with abdominal pain. -Wants to hold off diet still. -Hopeful to start clears in am if symptomatically improved.  Hypokalemia -2/2 n/v. -Repleted.   Code Status: Full Code Family Communication: Patient only.  Disposition Plan: Home when medically stable.   Consultants:  None   Antibiotics:  None   Subjective: Still with some abdominal pain. No n/v.  Objective: Filed Vitals:   02/15/13 2151 02/16/13 0601 02/16/13 0608 02/16/13 1352  BP: 138/70 130/46 163/66 137/69  Pulse: 88 81  70  Temp: 97.9 F (36.6 C) 98 F (36.7 C)  98.4 F (36.9 C)  TempSrc: Oral Oral  Oral  Resp: 18 16  16   Height:      Weight:      SpO2: 96% 95%  97%    Intake/Output Summary (Last 24 hours) at 02/16/13 1422 Last data filed at 02/16/13 0602  Gross per 24 hour  Intake 108.33 ml  Output   1850 ml  Net -1741.67 ml   Filed Weights   02/15/13 1020  Weight: 81.647 kg (180 lb)    Exam:   General:  AA Ox3, NAD  Cardiovascular: RRR, no M/R/G  Respiratory: CTA B  Abdomen: S/NT/ND/+BS  Extremities: no C/C/E   Neurologic:  Grossly intact and non-focal.  Data Reviewed: Basic Metabolic Panel:  Recent Labs Lab 02/13/13 2209 02/15/13 0741 02/16/13 0417  NA 139 136 134*  K 3.4* 3.4* 3.9  CL 101 98 98  CO2 28 27 25   GLUCOSE 85 96 73  BUN 18 12 13   CREATININE 1.08 1.04 0.95  CALCIUM 10.5 10.3 10.3   Liver Function Tests:  Recent Labs Lab 02/13/13 2209 02/15/13 0741 02/16/13 0417  AST 23 20 17   ALT 15 11 10   ALKPHOS 71 67 70  BILITOT 0.6 1.1 1.0  PROT 7.9 7.6 7.5  ALBUMIN 4.4 4.3 4.1    Recent Labs Lab 02/13/13 2209 02/15/13 0741 02/16/13 0417  LIPASE 208* 918* 634*   No results found for this basename: AMMONIA,  in the last 168 hours CBC:  Recent Labs Lab  02/13/13 2209 02/15/13 0741 02/16/13 0417  WBC 9.4 10.4 8.1  NEUTROABS 7.0 7.6 6.2  HGB 13.5 13.1 13.1  HCT 38.9* 37.0* 36.6*  MCV 86.3 86.0 85.3  PLT 206 192 202   Cardiac Enzymes: No results found for this basename: CKTOTAL, CKMB, CKMBINDEX, TROPONINI,  in the last 168 hours BNP (last 3 results) No results found for this basename: PROBNP,  in the last 8760 hours CBG: No results found for this basename: GLUCAP,  in the last 168 hours  No results found for this or any previous visit (from the past 240 hour(s)).   Studies: No results found.  Scheduled Meds: . enoxaparin (LOVENOX) injection  40 mg Subcutaneous Q24H   Continuous Infusions: . sodium chloride 125 mL/hr at 02/16/13 0758    Active Problems:   Pancreatitis   Hypokalemia    Time spent: 35 minutes.    Chaya Jan  [facility] Pager [phone]  If 7PM-7AM, please contact night-coverage at [email or web address], password [facility] 02/16/2013, 2:22 PM  LOS: 1 day

## 2013-02-17 LAB — CBC WITH DIFFERENTIAL/PLATELET
Basophils Absolute: 0 10*3/uL (ref 0.0–0.1)
Basophils Relative: 0 % (ref 0–1)
HCT: 36.7 % — ABNORMAL LOW (ref 39.0–52.0)
Hemoglobin: 12.8 g/dL — ABNORMAL LOW (ref 13.0–17.0)
MCH: 29.8 pg (ref 26.0–34.0)
MCHC: 34.9 g/dL (ref 30.0–36.0)
MCV: 85.3 fL (ref 78.0–100.0)
Monocytes Absolute: 0.7 10*3/uL (ref 0.1–1.0)
Monocytes Relative: 11 % (ref 3–12)
Neutro Abs: 3.9 10*3/uL (ref 1.7–7.7)
Platelets: 218 10*3/uL (ref 150–400)

## 2013-02-17 LAB — LIPASE, BLOOD: Lipase: 275 U/L — ABNORMAL HIGH (ref 11–59)

## 2013-02-17 MED ORDER — OXYCODONE HCL 5 MG PO TABS
5.0000 mg | ORAL_TABLET | ORAL | Status: DC | PRN
  Administered 2013-02-17 (×4): 5 mg via ORAL
  Filled 2013-02-17 (×5): qty 1

## 2013-02-17 NOTE — Progress Notes (Signed)
[facility] PROGRESS NOTE  Adrian Murray ZOX:[id number] DOB: 01-14-1991 DOA: 02/15/2013 PCP: Provider Not In System  Assessment/Plan: Acute Pancreatitis -No further n/v or pain. -Tolerated clears for breakfast. -Will continue to advance and consider DC home later today if tolerates solid diet.  Hypokalemia -2/2 n/v. -Repleted.   Code Status: Full Code Family Communication: Patient only.  Disposition Plan: Home when medically stable.   Consultants:  None   Antibiotics:  None   Subjective: No complaints.  Objective: Filed Vitals:   02/16/13 0608 02/16/13 1352 02/16/13 2133 02/17/13 0600  BP: 163/66 137/69 145/78 136/76  Pulse:  70 67 66  Temp:  98.4 F (36.9 C) 98.1 F (36.7 C) 98.4 F (36.9 C)  TempSrc:  Oral Oral Oral  Resp:  16 16 16   Height:      Weight:      SpO2:  97% 98% 96%    Intake/Output Summary (Last 24 hours) at 02/17/13 1033 Last data filed at 02/17/13 0600  Gross per 24 hour  Intake 4395.83 ml  Output   2150 ml  Net 2245.83 ml   Filed Weights   02/15/13 1020  Weight: 81.647 kg (180 lb)    Exam:   General:  AA Ox3, NAD  Cardiovascular: RRR, no M/R/G  Respiratory: CTA B  Abdomen: S/NT/ND/+BS  Extremities: no C/C/E   Neurologic:  Grossly intact and non-focal.  Data Reviewed: Basic Metabolic Panel:  Recent Labs Lab 02/13/13 2209 02/15/13 0741 02/16/13 0417  NA 139 136 134*  K 3.4* 3.4* 3.9  CL 101 98 98  CO2 28 27 25   GLUCOSE 85 96 73  BUN 18 12 13   CREATININE 1.08 1.04 0.95  CALCIUM 10.5 10.3 10.3   Liver Function Tests:  Recent Labs Lab 02/13/13 2209 02/15/13 0741 02/16/13 0417  AST 23 20 17   ALT 15 11 10   ALKPHOS 71 67 70  BILITOT 0.6 1.1 1.0  PROT 7.9 7.6 7.5  ALBUMIN 4.4 4.3 4.1    Recent Labs Lab 02/13/13 2209 02/15/13 0741 02/16/13 0417 02/17/13 0415  LIPASE 208* 918* 634* 275*   No results found for this basename: AMMONIA,  in the last 168 hours CBC:  Recent Labs Lab  02/13/13 2209 02/15/13 0741 02/16/13 0417 02/17/13 0415  WBC 9.4 10.4 8.1 5.8  NEUTROABS 7.0 7.6 6.2 3.9  HGB 13.5 13.1 13.1 12.8*  HCT 38.9* 37.0* 36.6* 36.7*  MCV 86.3 86.0 85.3 85.3  PLT 206 192 202 218   Cardiac Enzymes: No results found for this basename: CKTOTAL, CKMB, CKMBINDEX, TROPONINI,  in the last 168 hours BNP (last 3 results) No results found for this basename: PROBNP,  in the last 8760 hours CBG: No results found for this basename: GLUCAP,  in the last 168 hours  No results found for this or any previous visit (from the past 240 hour(s)).   Studies: No results found.  Scheduled Meds: . enoxaparin (LOVENOX) injection  40 mg Subcutaneous Q24H   Continuous Infusions: . sodium chloride 125 mL/hr at 02/17/13 0716    Active Problems:   Pancreatitis   Hypokalemia    Time spent: 35 minutes.    Chaya Jan  [facility] Pager [phone]  If 7PM-7AM, please contact night-coverage at [email or web address], password [facility] 02/17/2013, 10:33 AM  LOS: 2 days

## 2013-02-18 LAB — CBC WITH DIFFERENTIAL/PLATELET
Eosinophils Absolute: 0.1 10*3/uL (ref 0.0–0.7)
Eosinophils Relative: 1 % (ref 0–5)
Lymphocytes Relative: 25 % (ref 12–46)
Lymphs Abs: 1.1 10*3/uL (ref 0.7–4.0)
MCH: 29.6 pg (ref 26.0–34.0)
MCV: 85.7 fL (ref 78.0–100.0)
Monocytes Relative: 10 % (ref 3–12)
Neutro Abs: 2.8 10*3/uL (ref 1.7–7.7)
Platelets: 200 10*3/uL (ref 150–400)
RBC: 3.99 MIL/uL — ABNORMAL LOW (ref 4.22–5.81)

## 2013-02-18 LAB — LIPASE, BLOOD: Lipase: 154 U/L — ABNORMAL HIGH (ref 11–59)

## 2013-02-18 MED ORDER — HYDROCODONE-ACETAMINOPHEN 5-325 MG PO TABS: 1.0000 | ORAL_TABLET | Freq: Four times a day (QID) | ORAL | Status: DC | PRN

## 2013-02-18 NOTE — Discharge Summary (Signed)
Physician Discharge Summary  Adrian Murray ZOX:[id number] DOB: April 07, 1991 DOA: 02/15/2013  PCP: Provider Not In System  Admit date: 02/15/2013 Discharge date: 02/18/2013  Time spent: 45 minutes  Recommendations for Outpatient Follow-up:   -Will be discharged home today.  Discharge Diagnoses:  Active Problems:   Pancreatitis   Hypokalemia   Discharge Condition: Stable and improved  Filed Weights   02/15/13 1020  Weight: 81.647 kg (180 lb)    History of present illness:  Adrian Murray 22 yo BM PMHx Acute Pancreatitis diagnosed 02/13/2013 (lipase 208, now 918) secondary to alcohol abuse. States last use of alcohol 2 days ago. States is a binge drinker (only drinks on weekends usually 4-5 beers per week and); the patient was discharged home on by mouth pain medication and instructions to remain n.p.o. Presents with left upper quadrant pain for the last 4 days. The patient was seen here 2 days ago and diagnosed with pancreatitis. CT non-contrib, elev lipase in the 200's. He notes that 4 days ago he drank heavily and began having abdominal pain the following day. He states that his pain has persisted despite taking narcotic pain medicine. He is able to tolerate liquids at home but has not attempted solid food yet. He denies any vomiting, diarrhea, or fevers. He notes no worsening in his pain but continued pain. He is afebrile and vital signs are unremarkable here. Will get pain control, IV fluids, and repeat lab work. TODAY states was sent home from the ED 2 days ago with a new diagnosis of pancreatitis, and instructed to only drink clear liquids and use by mouth pain medication. States pain became stronger more had to return to the ED. Currently states pain is just mild but just recently was given IV pain medication. We were asked to admit him for further evaluation and management.   Hospital Course:   Acute Pancreatitis  -No further n/v or pain.  -Has been tolerating a solid  diet.  Hypokalemia  -2/2 n/v.  -Repleted.   Procedures:  None   Consultations:  None  Discharge Instructions  Discharge Orders   Future Orders Complete By Expires   Discontinue IV  As directed        Medication List         HYDROcodone-acetaminophen 5-325 MG per tablet  Commonly known as:  NORCO  Take 1 tablet by mouth every 6 (six) hours as needed.       No Known Allergies    The results of significant diagnostics from this hospitalization (including imaging, microbiology, ancillary and laboratory) are listed below for reference.    Significant Diagnostic Studies: Ct Abdomen Pelvis W Contrast  02/14/2013   CLINICAL DATA:  22 year old male with epigastric pain. Initial encounter.  EXAM: CT ABDOMEN AND PELVIS WITH CONTRAST  TECHNIQUE: Multidetector CT imaging of the abdomen and pelvis was performed using the standard protocol following bolus administration of intravenous contrast.  CONTRAST:  50mL OMNIPAQUE IOHEXOL 300 MG/ML SOLN, OMNIPAQUE IOHEXOL 300 MG/ML SOLN  COMPARISON:  Abdomen radiographs from the same day.  FINDINGS: There is mild cardiomegaly. No pericardial or pleural effusion. Negative lung bases. No acute osseous abnormality identified.  No pelvic free fluid. Diminutive bladder appears normal. Negative distal colon. Negative left colon. Retained stool in the transverse colon which otherwise appears normal. Right colon is within normal limits. Normal appendix best seen on coronal image 32. No dilated small bowel. Oral contrast has not yet reached the distal small bowel.  Stomach is within  normal limits. Duodenum within normal limits. Negative early vascular contrast phase appearance of the liver. Gallbladder, spleen, pancreas, adrenal glands, portal venous system, and major arterial structures in the abdomen and pelvis are within normal limits. No abdominal free fluid. Normal kidneys.  IMPRESSION: Normal appendix. Negative CT of the abdomen and pelvis.    Electronically Signed   By: Augusto Gamble M.D.   On: 02/14/2013 00:14   Dg Abd 2 Views  02/13/2013   CLINICAL DATA:  Left-sided abdominal pain.  EXAM: ABDOMEN - 2 VIEW  COMPARISON:  None.  FINDINGS: The bowel gas pattern is normal. No free air or free fluid. Tiny calcification in the left side of the pelvis. No osseous abnormality of significance.  IMPRESSION: Tiny calcification in the left side of the pelvis. This could represent a phlebolith or a distal left ureteral stone. Does the patient have hematuria or left flank pain?   Electronically Signed   By: Geanie Cooley M.D.   On: 02/13/2013 21:58    Microbiology: No results found for this or any previous visit (from the past 240 hour(s)).   Labs: Basic Metabolic Panel:  Recent Labs Lab 02/13/13 2209 02/15/13 0741 02/16/13 0417  NA 139 136 134*  K 3.4* 3.4* 3.9  CL 101 98 98  CO2 28 27 25   GLUCOSE 85 96 73  BUN 18 12 13   CREATININE 1.08 1.04 0.95  CALCIUM 10.5 10.3 10.3   Liver Function Tests:  Recent Labs Lab 02/13/13 2209 02/15/13 0741 02/16/13 0417  AST 23 20 17   ALT 15 11 10   ALKPHOS 71 67 70  BILITOT 0.6 1.1 1.0  PROT 7.9 7.6 7.5  ALBUMIN 4.4 4.3 4.1    Recent Labs Lab 02/13/13 2209 02/15/13 0741 02/16/13 0417 02/17/13 0415 02/18/13 0412  LIPASE 208* 918* 634* 275* 154*   No results found for this basename: AMMONIA,  in the last 168 hours CBC:  Recent Labs Lab 02/13/13 2209 02/15/13 0741 02/16/13 0417 02/17/13 0415 02/18/13 0412  WBC 9.4 10.4 8.1 5.8 4.3  NEUTROABS 7.0 7.6 6.2 3.9 2.8  HGB 13.5 13.1 13.1 12.8* 11.8*  HCT 38.9* 37.0* 36.6* 36.7* 34.2*  MCV 86.3 86.0 85.3 85.3 85.7  PLT 206 192 202 218 200   Cardiac Enzymes: No results found for this basename: CKTOTAL, CKMB, CKMBINDEX, TROPONINI,  in the last 168 hours BNP: BNP (last 3 results) No results found for this basename: PROBNP,  in the last 8760 hours CBG: No results found for this basename: GLUCAP,  in the last 168  hours     Signed:  Chaya Jan  [facility] Pager: [phone] 02/18/2013, 9:22 AM

## 2013-09-22 ENCOUNTER — Ambulatory Visit (INDEPENDENT_AMBULATORY_CARE_PROVIDER_SITE_OTHER): Payer: 59 | Admitting: Cardiovascular Disease

## 2013-09-22 ENCOUNTER — Encounter: Payer: Self-pay | Admitting: Cardiovascular Disease

## 2013-09-22 VITALS — BP 150/70 | HR 61 | Ht 74.0 in | Wt 178.0 lb

## 2013-09-22 DIAGNOSIS — I517 Cardiomegaly: Secondary | ICD-10-CM

## 2013-09-22 DIAGNOSIS — R9431 Abnormal electrocardiogram [ECG] [EKG]: Secondary | ICD-10-CM

## 2013-09-22 NOTE — Assessment & Plan Note (Signed)
Patient was referred to me by his PCP, Dr. Janace LittenGurley, for abnormal EKG and left arm episodic tingling and pain. The patient has no cardiac risk factors. His EKG shows LVH voltage with preexcitation (short PR interval and delta wave). He's had no episodes consistent with WPW. I'm going to get a 2-D echocardiogram. I've given him a copy of his EKG. I will see him back when necessary. I think his left upper extremity symptoms are related to cervical radiculopathy.

## 2013-09-22 NOTE — Patient Instructions (Signed)
  We will see you back in follow up as needed.   Dr Allyson SabalBerry has ordered an echocardiogram. Echocardiography is a painless test that uses sound waves to create images of your heart. It provides your doctor with information about the size and shape of your heart and how well your heart's chambers and valves are working. This procedure takes approximately one hour. There are no restrictions for this procedure.

## 2013-09-22 NOTE — Progress Notes (Signed)
     09/22/2013 Adrian Murray   1991-04-12  [id number]  Primary Physician GURLEY,Scott, PA-C Primary Cardiologist: Runell GessJonathan J. Viviana Trimble MD Roseanne RenoFACP,FACC,FAHA, FSCAI   HPI:  Adrian Murray is a 23 -year-old single appearing African American male with no children he works at [employer] and also is doing an Associate Professorinternship at [employer]. He was referred by his primary care physician, Dr. Janace LittenGurley, because of an abnormal EKG and left upper extremity tingling/discomfort. He has no cardiac risk factors. He had pain in his left tricep and back as well as tingling in his left hand which to me sounds more radiculopathic. His EKG shows LVH bulked voltage with preexcitation though he's had no symptoms of WPW. He denies chest pain or shortness of breath.   No current outpatient prescriptions on file.   No current facility-administered medications for this visit.    No Known Allergies  History   Social History  . Marital Status: Single    Spouse Name: N/A    Number of Children: N/A  . Years of Education: N/A   Occupational History  . Not on file.   Social History Main Topics  . Smoking status: Never Smoker   . Smokeless tobacco: Never Used  . Alcohol Use: Yes     Comment: occasional  . Drug Use: No  . Sexual Activity: No   Other Topics Concern  . Not on file   Social History Narrative  . No narrative on file     Review of Systems: General: negative for chills, fever, night sweats or weight changes.  Cardiovascular: negative for chest pain, dyspnea on exertion, edema, orthopnea, palpitations, paroxysmal nocturnal dyspnea or shortness of breath Dermatological: negative for rash Respiratory: negative for cough or wheezing Urologic: negative for hematuria Abdominal: negative for nausea, vomiting, diarrhea, bright red blood per rectum, melena, or hematemesis Neurologic: negative for visual changes, syncope, or dizziness All other systems reviewed and are otherwise negative except as  noted above.    Blood pressure 150/70, pulse 61, height 6\' 2"  (1.88 m), weight 178 lb (80.74 kg).  General appearance: alert and no distress Neck: no adenopathy, no carotid bruit, no JVD, supple, symmetrical, trachea midline and thyroid not enlarged, symmetric, no tenderness/mass/nodules Lungs: clear to auscultation bilaterally Heart: regular rate and rhythm, S1, S2 normal, no murmur, click, rub or gallop  EKG normal sinus rhythm at 61 with LVH voltage and preexcitation  ASSESSMENT AND PLAN:   Abnormal EKG Patient was referred to me by his PCP, Dr. Janace LittenGurley, for abnormal EKG and left arm episodic tingling and pain. The patient has no cardiac risk factors. His EKG shows LVH voltage with preexcitation (short PR interval and delta wave). He's had no episodes consistent with WPW. I'm going to get a 2-D echocardiogram. I've given him a copy of his EKG. I will see him back when necessary. I think his left upper extremity symptoms are related to cervical radiculopathy.      Runell GessJonathan J. Haldon Carley MD FACP,FACC,FAHA, [facility] 09/22/2013 12:32 PM

## 2013-10-03 ENCOUNTER — Ambulatory Visit (HOSPITAL_COMMUNITY)
Admission: RE | Admit: 2013-10-03 | Discharge: 2013-10-03 | Disposition: A | Discharge status: Home / Self Care | Payer: 59 | Source: Ambulatory Visit | Attending: Cardiovascular Disease | Admitting: Cardiovascular Disease

## 2013-10-03 ENCOUNTER — Institutional Professional Consult (permissible substitution): Payer: 59 | Admitting: Cardiovascular Disease

## 2013-10-03 DIAGNOSIS — I369 Nonrheumatic tricuspid valve disorder, unspecified: Secondary | ICD-10-CM

## 2013-10-03 DIAGNOSIS — I519 Heart disease, unspecified: Secondary | ICD-10-CM | POA: Insufficient documentation

## 2013-10-03 DIAGNOSIS — I517 Cardiomegaly: Secondary | ICD-10-CM

## 2013-10-03 DIAGNOSIS — R9431 Abnormal electrocardiogram [ECG] [EKG]: Secondary | ICD-10-CM | POA: Insufficient documentation

## 2013-10-03 NOTE — Progress Notes (Signed)
2D Echo Performed 10/03/2013    Nicolet Griffy, RCS  

## 2014-01-26 ENCOUNTER — Other Ambulatory Visit: Payer: Self-pay

## 2015-04-04 IMAGING — CT CT ABD-PELV W/ CM
1 of 2 series · 15 of 32 positions shown, 19 images · IV contrast (omnipaque)
Comparison: Abdomen radiographs from the same day.

CLINICAL DATA: 22-year-old male with epigastric pain. Initial
encounter.

EXAM:
CT ABDOMEN AND PELVIS WITH CONTRAST
TECHNIQUE: Multidetector CT imaging of the abdomen and pelvis was performed
using the standard protocol following bolus administration of
intravenous contrast.
CONTRAST:  50mL OMNIPAQUE IOHEXOL 300 MG/ML SOLN, 100mL OMNIPAQUE
IOHEXOL 300 MG/ML SOLN

[Series 2: abd/pel with · axial · 0.67mm/px · z∈[-420,-40]mm · 15 of 84 slices shown, 19 images]
[im 4/84  soft-tissue]
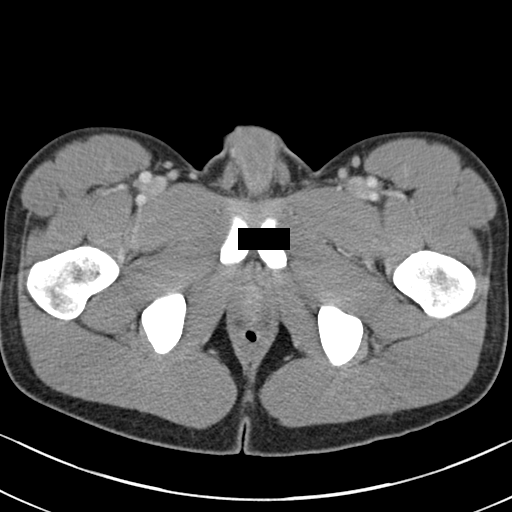
[im 4/84  bone]
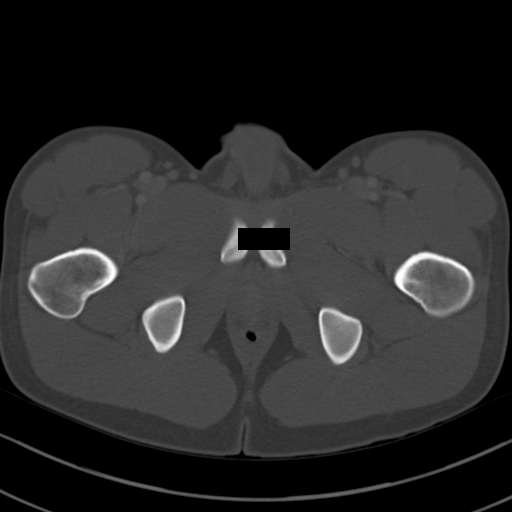
[im 10/84  soft-tissue]
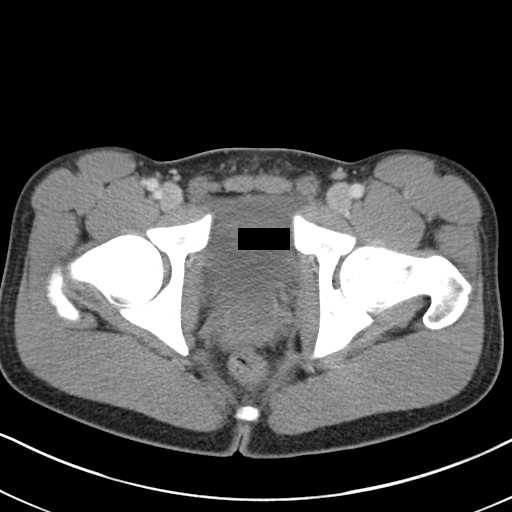
[im 17/84  soft-tissue]
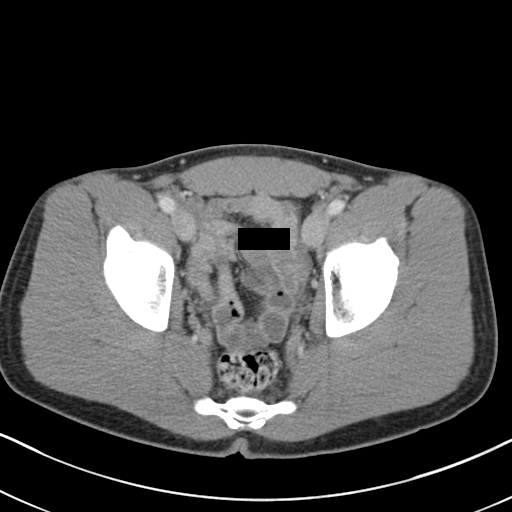
[im 24/84  soft-tissue]
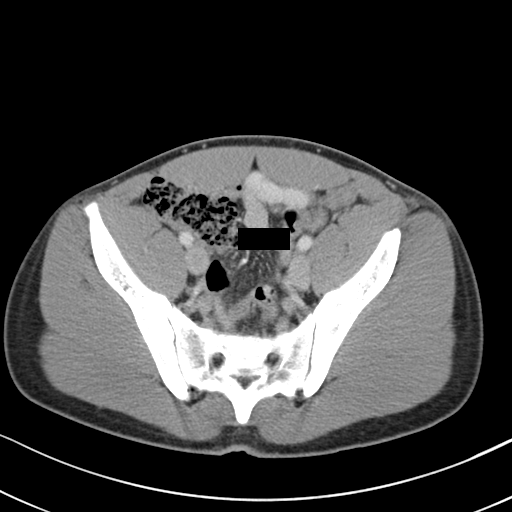
[im 30/84  soft-tissue]
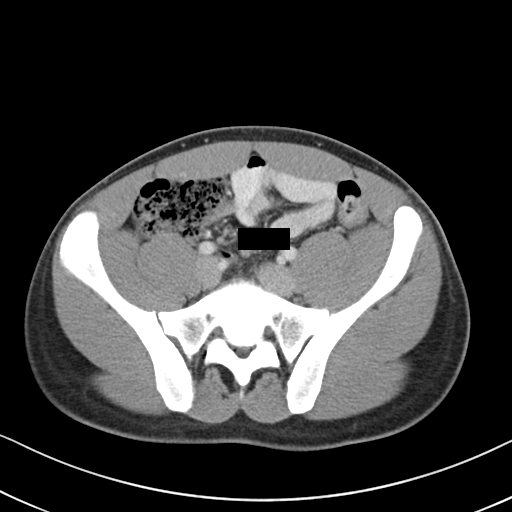
[im 37/84  soft-tissue]
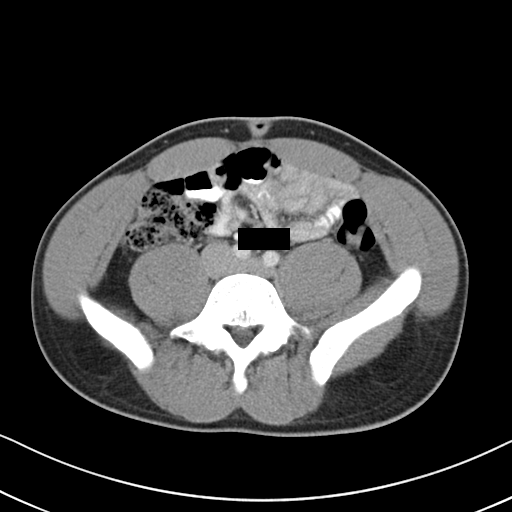
[im 44/84  soft-tissue]
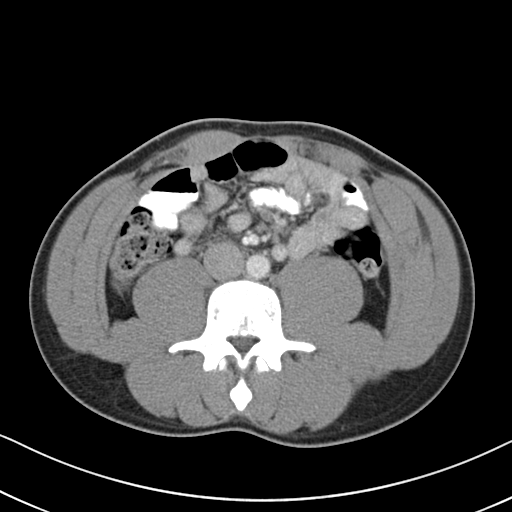
[im 47/84  soft-tissue]
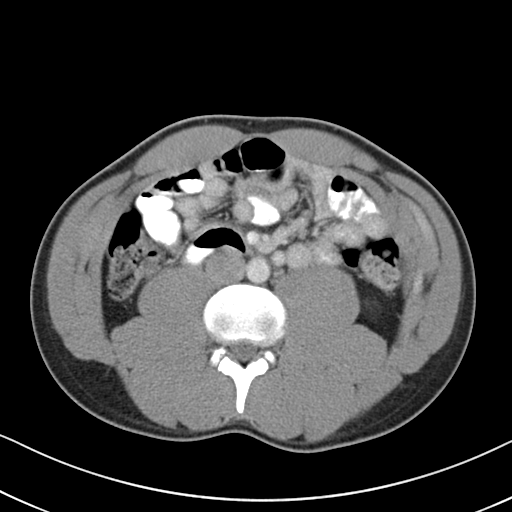
[im 54/84  soft-tissue]
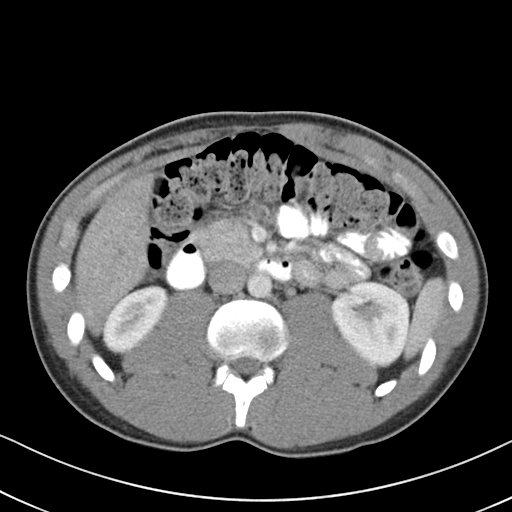
[im 54/84  bone]
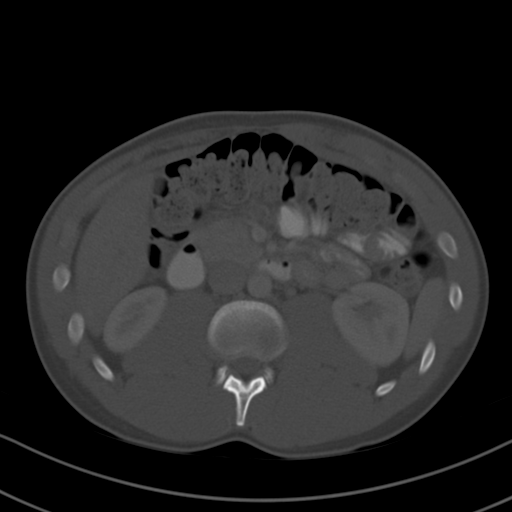
[im 60/84  soft-tissue]
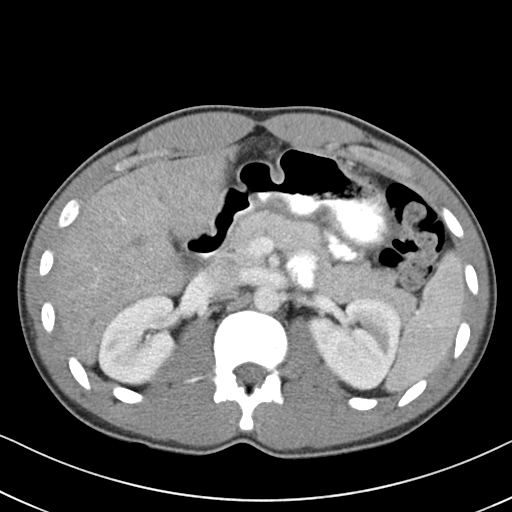
[im 67/84  soft-tissue]
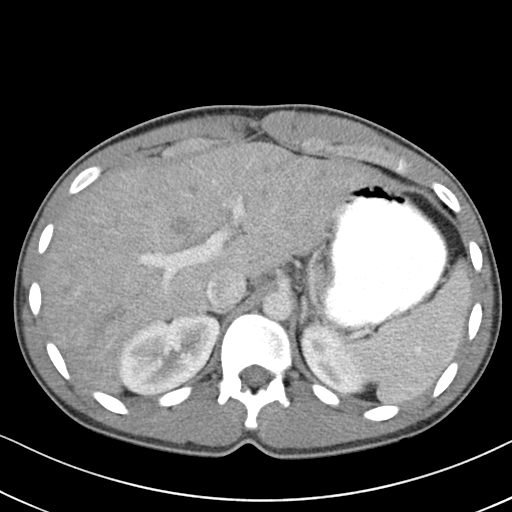
[im 70/84  lung]
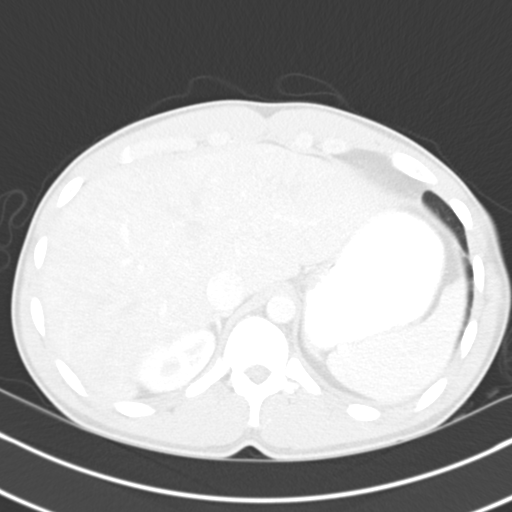
[im 74/84  soft-tissue]
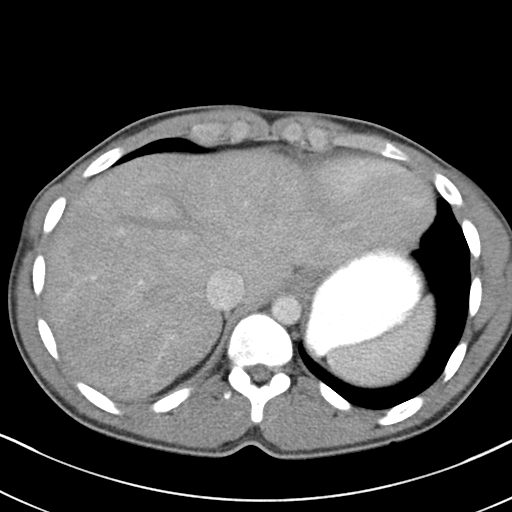
[im 74/84  lung]
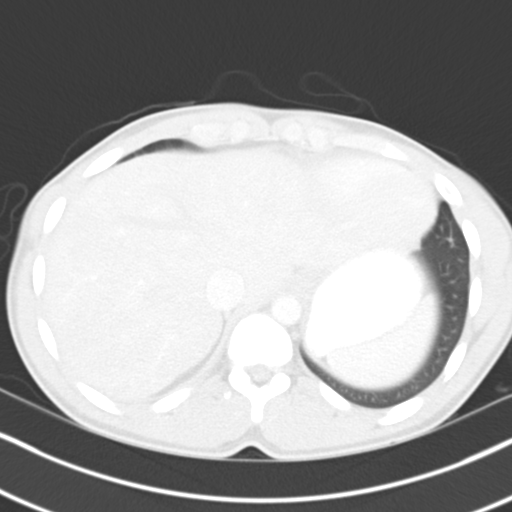
[im 77/84  lung]
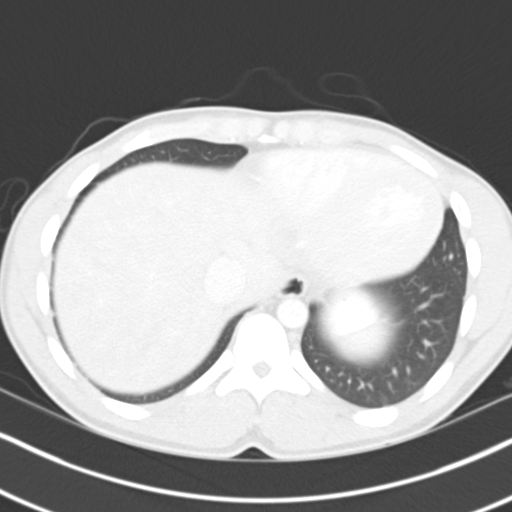
[im 80/84  soft-tissue]
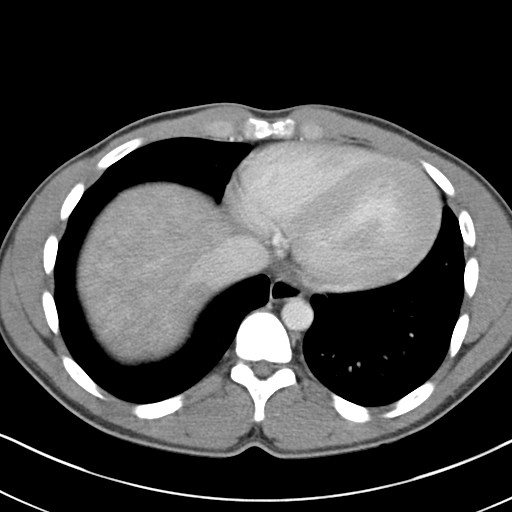
[im 80/84  lung]
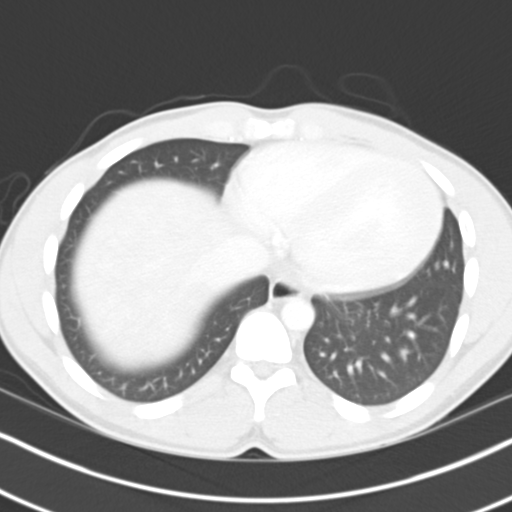

[15 of 32 positions shown; findings below may reference images not displayed]

FINDINGS: There is mild cardiomegaly. No pericardial or pleural effusion.
Negative lung bases. No acute osseous abnormality identified.

No pelvic free fluid. Diminutive bladder appears normal. Negative
distal colon. Negative left colon. Retained stool in the transverse
colon which otherwise appears normal. Right colon is within normal
limits. Normal appendix best seen on coronal image 32. No dilated
small bowel. Oral contrast has not yet reached the distal small
bowel.

Stomach is within normal limits. Duodenum within normal limits.
Negative early vascular contrast phase appearance of the liver.
Gallbladder, spleen, pancreas, adrenal glands, portal venous system,
and major arterial structures in the abdomen and pelvis are within
normal limits. No abdominal free fluid. Normal kidneys.
IMPRESSION: Normal appendix. Negative CT of the abdomen and pelvis.

## 2023-09-13 ENCOUNTER — Encounter (HOSPITAL_COMMUNITY): Payer: Self-pay

## 2023-09-13 ENCOUNTER — Other Ambulatory Visit: Payer: Self-pay

## 2023-09-13 ENCOUNTER — Emergency Department (HOSPITAL_COMMUNITY)
Admission: EM | Admit: 2023-09-13 | Discharge: 2023-09-13 | Disposition: A | Discharge status: Home / Self Care | Attending: Emergency Medicine | Admitting: Emergency Medicine

## 2023-09-13 ENCOUNTER — Emergency Department (HOSPITAL_COMMUNITY)

## 2023-09-13 DIAGNOSIS — R0789 Other chest pain: Secondary | ICD-10-CM | POA: Diagnosis not present

## 2023-09-13 DIAGNOSIS — R079 Chest pain, unspecified: Secondary | ICD-10-CM | POA: Diagnosis present

## 2023-09-13 LAB — BASIC METABOLIC PANEL WITH GFR
Anion gap: 11 (ref 5–15)
BUN: 21 mg/dL — ABNORMAL HIGH (ref 6–20)
CO2: 24 mmol/L (ref 22–32)
Calcium: 9.4 mg/dL (ref 8.9–10.3)
Chloride: 102 mmol/L (ref 98–111)
Creatinine, Ser: 1.11 mg/dL (ref 0.61–1.24)
GFR, Estimated: >60 mL/min (ref >60)
Glucose, Bld: 130 mg/dL — ABNORMAL HIGH (ref 70–99)
Potassium: 3.6 mmol/L (ref 3.5–5.1)
Sodium: 137 mmol/L (ref 135–145)

## 2023-09-13 LAB — CBC
HCT: 43.1 % (ref 39.0–52.0)
Hemoglobin: 14.5 g/dL (ref 13.0–17.0)
MCH: 29.3 pg (ref 26.0–34.0)
MCHC: 33.6 g/dL (ref 30.0–36.0)
MCV: 87.1 fL (ref 80.0–100.0)
Platelets: 176 10*3/uL (ref 150–400)
RBC: 4.95 MIL/uL (ref 4.22–5.81)
RDW: 12.2 % (ref 11.5–15.5)
WBC: 5.1 10*3/uL (ref 4.0–10.5)
nRBC: 0 % (ref 0.0–0.2)

## 2023-09-13 LAB — TROPONIN I (HIGH SENSITIVITY)
Troponin I (High Sensitivity): 40 ng/L — ABNORMAL HIGH (ref <18)
Troponin I (High Sensitivity): 34 ng/L — ABNORMAL HIGH (ref <18)

## 2023-09-13 MED ORDER — DICLOFENAC SODIUM 1 % EX GEL: 4.0000 g | Freq: Four times a day (QID) | CUTANEOUS | 0 refills | Status: AC

## 2023-09-13 NOTE — ED Triage Notes (Signed)
 Pt came in via POV d/t being woke out of his sleep d/t Rt hand/arm tingling /numbness & then it spread to his Lt arm as well & feeling pressure in his Lt side of chest. Denies any other pain radiation. A/Ox4, denies current pain, just pressure but will have intermittent 6/10 pain like sharp feeling. Reports feeling jittery last night well, chills & SOB as well.

## 2023-09-13 NOTE — ED Provider Triage Note (Signed)
 Emergency Medicine Provider Triage Evaluation Note  Adrian Murray , a 33 y.o. male  was evaluated in triage.  Pt complains of chest pressure, arm tingling, shortness of breath intermittently since last night around 10 PM.  Went to the ED at around midnight, clear workup at that time, left out around 4 in the morning.  Reports symptoms had resolved by 9 AM.  Symptoms then began again about an hour later.  Rates the chest pressure as 7/10.  He reports some intermittent tingling still on the left side at the hand/arm.  There was some miscommunication about patient potentially taking some preworkout, he denies taking anything stimulant or containing caffeine today or yesterday.  He reports he has not followed with his cardiologist in a while, but had been seen previously by Dr. Katheryne Pane.  Review of Systems  Positive: Chest pain, shob, arm tingling Negative:   Physical Exam  BP (!) 145/76 (BP Location: Right Arm)   Pulse 62   Temp 98 F (36.7 C)   Resp 18   Ht 6\' 1"  (1.854 m)   Wt 95.3 kg   SpO2 100%   BMI 27.71 kg/m  Gen:   Awake, no distress   Resp:  Normal effort  MSK:   Moves extremities without difficulty  Other:    Medical Decision Making  Medically screening exam initiated at 12:04 PM.  Appropriate orders placed.  Dufm Gibbon was informed that the remainder of the evaluation will be completed by another provider, this initial triage assessment does not replace that evaluation, and the importance of remaining in the ED until their evaluation is complete.  Workup initiated in triage Significant left-ventricular hypertrophy noted on EKG performed in triage, we spoke to the cardiologist who reports fairly similar compared to his previous, nonetheless we will run cardiac enzymes, and placed patient on cardiac monitoring once we have him placed in room.   Nelly Banco, PA-C 09/13/23 1206

## 2023-09-13 NOTE — ED Provider Notes (Signed)
 [place] [facility] AT [facility] Provider Note   CSN: [id number] Arrival date & time: 09/13/23  1059     History  Chief Complaint  Patient presents with   Shortness of Breath   Chest Pain    Adrian Murray is a 33 y.o. male.  33 yo M with a chief complaints of chest pain.  This is left-sided radiates to the left arm.  Has been off and on.  Occurred last night he went to an outside hospital and was seen.  And then was discharged home.  Pain reoccurred this morning.  Has improved a bit but then reoccurred while he was in the waiting room.   Shortness of Breath Associated symptoms: chest pain   Chest Pain Associated symptoms: shortness of breath        Home Medications Prior to Admission medications   Medication Sig Start Date End Date Taking? Authorizing Provider  diclofenac Sodium (VOLTAREN) 1 % GEL Apply 4 g topically 4 (four) times daily. 09/13/23  Yes Hayward Rylander, DO      Allergies    Patient has no known allergies.    Review of Systems   Review of Systems  Respiratory:  Positive for shortness of breath.   Cardiovascular:  Positive for chest pain.    Physical Exam Updated Vital Signs BP 132/80   Pulse (!) 58   Temp 98 F (36.7 C)   Resp (!) 22   Ht 6\' 1"  (1.854 m)   Wt 95.3 kg   SpO2 100%   BMI 27.71 kg/m  Physical Exam Vitals and nursing note reviewed.  Constitutional:      Appearance: He is well-developed.  HENT:     Head: Normocephalic and atraumatic.  Eyes:     Pupils: Pupils are equal, round, and reactive to light.  Neck:     Vascular: No JVD.  Cardiovascular:     Rate and Rhythm: Normal rate and regular rhythm.     Heart sounds: No murmur heard.    No friction rub. No gallop.  Pulmonary:     Effort: No respiratory distress.     Breath sounds: No wheezing.  Chest:     Chest wall: Tenderness present.     Comments: Patient does have some discomfort with palpation of the left anterior chest. Abdominal:     General:  There is no distension.     Tenderness: There is no abdominal tenderness. There is no guarding or rebound.  Musculoskeletal:        General: Normal range of motion.     Cervical back: Normal range of motion and neck supple.  Skin:    Coloration: Skin is not pale.     Findings: No rash.  Neurological:     Mental Status: He is alert and oriented to person, place, and time.  Psychiatric:        Behavior: Behavior normal.     ED Results / Procedures / Treatments   Labs (all labs ordered are listed, but only abnormal results are displayed) Labs Reviewed  BASIC METABOLIC PANEL WITH GFR - Abnormal; Notable for the following components:      Result Value   Glucose, Bld 130 (*)    BUN 21 (*)    All other components within normal limits  TROPONIN I (HIGH SENSITIVITY) - Abnormal; Notable for the following components:   Troponin I (High Sensitivity) 40 (*)    All other components within normal limits  TROPONIN I (HIGH  SENSITIVITY) - Abnormal; Notable for the following components:   Troponin I (High Sensitivity) 34 (*)    All other components within normal limits  CBC    EKG EKG Interpretation Date/Time:  Monday September 13 2023 11:14:45 EDT Ventricular Rate:  64 PR Interval:  84 QRS Duration:  126 QT Interval:  448 QTC Calculation: 462 R Axis:   4  Text Interpretation: Sinus rhythm with short PR Left ventricular hypertrophy with QRS widening ( R in aVL , Sokolow-Lyon , Cornell product , Romhilt-Estes ) Marked T-wave abnormality, consider inferolateral ischemia Abnormal ECG ekg not in muse seen in clinic with similar t wave inversion pattern more pronounced Otherwise no significant change Confirmed by Albertus Hughs [phone]) on 09/13/2023 1:01:05 PM  Radiology DG Chest 2 View Result Date: 09/13/2023 CLINICAL DATA:  Chest pain EXAM: CHEST - 2 VIEW COMPARISON:  None Available. FINDINGS: Midline trachea. Moderate cardiomegaly. No pleural effusion or pneumothorax. No congestive failure. IMPRESSION:  Cardiomegaly without congestive failure. Electronically Signed   By: Lore Rode M.D.   On: 09/13/2023 12:46    Procedures Procedures    Medications Ordered in ED Medications - No data to display  ED Course/ Medical Decision Making/ A&P                                 Medical Decision Making Amount and/or Complexity of Data Reviewed Labs: ordered. Radiology: ordered.   33 yo M with a chief complaint of chest pain.  Patient has a history of hypertrophic cardiomyopathy.  Has been seen in the cardiologist office in the past.  He came in with chest pain this been off and on since last night.  His EKG has inferior and lateral ST depression.  I discussed this with Dr. Addie Holstein, interventional cardiologist he reviewed the patient's EKG and thought it looked relatively similar to last check in the office.  Troponins are flat here.  No significant electrolyte abnormalities no anemia.  Chest x-ray independently interpreted by me without the pneumothorax.  Will discharge home.  Given cardiology follow-up.  2:56 PM:  I have discussed the diagnosis/risks/treatment options with the patient and family.  Evaluation and diagnostic testing in the emergency department does not suggest an emergent condition requiring admission or immediate intervention beyond what has been performed at this time.  They will follow up with Cards. We also discussed returning to the ED immediately if new or worsening sx occur. We discussed the sx which are most concerning (e.g., sudden worsening pain, fever, inability to tolerate by mouth,exertional symptoms, hemoptysis, syncope) that necessitate immediate return. Medications administered to the patient during their visit and any new prescriptions provided to the patient are listed below.  Medications given during this visit Medications - No data to display   The patient appears reasonably screen and/or stabilized for discharge and I doubt any other medical condition or  other [facility] requiring further screening, evaluation, or treatment in the ED at this time prior to discharge.          Final Clinical Impression(s) / ED Diagnoses Final diagnoses:  Nonspecific chest pain    Rx / DC Orders ED Discharge Orders          Ordered    Ambulatory referral to Cardiology       Comments: If you have not heard from the Cardiology office within the next 72 hours please call [phone].   09/13/23 1438  diclofenac Sodium (VOLTAREN) 1 % GEL  4 times daily        09/13/23 1439              Albertus Hughs, DO 09/13/23 1456

## 2023-09-13 NOTE — ED Notes (Signed)
 Patient transported to X-ray

## 2023-09-13 NOTE — Discharge Instructions (Signed)
 Your test here look ok.  Please follow up with the cardiologist in the office.    Use the gel as prescribed.  Also take tylenol  1000mg (2 extra strength) four times a day.
